# Patient Record
Sex: Male | Born: 1995 | Race: Black or African American | Hispanic: No | Marital: Single | Smoking: Never smoker
Health system: Southern US, Community
[De-identification: ages and names within clinical notes are randomized; demographics above are authoritative.]

## PROBLEM LIST (undated history)

## (undated) ENCOUNTER — Emergency Department (HOSPITAL_COMMUNITY): Admission: EM | Payer: Medicaid Other | Source: Home / Self Care

---
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
## 2017-01-28 ENCOUNTER — Encounter (HOSPITAL_COMMUNITY): Payer: Self-pay

## 2017-01-28 ENCOUNTER — Emergency Department (HOSPITAL_COMMUNITY)
Admission: EM | Admit: 2017-01-28 | Discharge: 2017-01-28 | Disposition: A | Discharge status: Home / Self Care | Payer: Self-pay | Attending: Emergency Medicine | Admitting: Emergency Medicine

## 2017-01-28 DIAGNOSIS — J029 Acute pharyngitis, unspecified: Secondary | ICD-10-CM | POA: Insufficient documentation

## 2017-01-28 DIAGNOSIS — F17200 Nicotine dependence, unspecified, uncomplicated: Secondary | ICD-10-CM | POA: Insufficient documentation

## 2017-01-28 LAB — RAPID STREP SCREEN (MED CTR MEBANE ONLY): Streptococcus, Group A Screen (Direct): NEGATIVE

## 2017-01-28 NOTE — ED Notes (Signed)
PA in room

## 2017-01-28 NOTE — ED Triage Notes (Signed)
Pt c/o sore throat x 1 week and body aches x 2 days. c/o fever. Vomited x 3.

## 2017-01-28 NOTE — Discharge Instructions (Signed)
As discussed, drink plenty of fluids to keep your urine clear. Reintroduce foods slowly starting with broth and blend crackers.  Warm tea with honey, lozenges, popscicles may help with the symptoms.  Follow up with your primary care provider next week.  Return to the emergency department if your throat feels swollen, you have difficulty swallowing, breathing, persistent fever or any other new concerning symptoms.

## 2017-01-28 NOTE — ED Provider Notes (Signed)
[facility]EMERGENCY DEPT Provider Note   CSN: [id number] Arrival date & time: 01/28/17  1038   By signing my name below, I, Freida Busman, attest that this documentation has been prepared under the direction and in the presence of Mathews Robinsons, [place]. Electronically Signed: Freida Busman, Scribe. 01/28/2017. 11:02 AM.  History   Chief Complaint Chief Complaint  Patient presents with  . Fever  . Sore Throat  . Generalized Body Aches     The history is provided by the patient. No language interpreter was used.    HPI Comments:  Jason Hess is a 21 y.o. male who presents to the Emergency Department complaining of a sore throat x 1 week. He notes his pain has progressively worsened and is exacerbated when swallowing but he is able to tolerate his secretions. Pt reports associated subjective fever, body aches, nausea, and vomiting x 2 days. He also notes decreased appetite but states he has been drinking fluids. He has taken OTC sinus meds without relief. No flu shot received this season. No known sick contacts. He denies congestion, cough, ear pain, diarrhea, dysuria, or hematuria.    History reviewed. No pertinent past medical history.  There are no active problems to display for this patient.   History reviewed. No pertinent surgical history.     Home Medications    Prior to Admission medications   Not on File    Family History History reviewed. No pertinent family history.  Social History Social History  Substance Use Topics  . Smoking status: Current Some Day Smoker  . Smokeless tobacco: Never Used  . Alcohol use Yes     Allergies   Patient has no known allergies.   Review of Systems Review of Systems  Constitutional: Positive for appetite change and fever. Negative for chills.  HENT: Positive for sore throat. Negative for congestion and ear pain.   Respiratory: Negative for cough.   Gastrointestinal: Positive for nausea and vomiting. Negative for diarrhea.    Genitourinary: Negative for dysuria and hematuria.  Musculoskeletal: Positive for myalgias.     Physical Exam Updated Vital Signs BP 115/67 (BP Location: Right Arm)   Pulse 89   Temp 99.3 F (37.4 C) (Oral)   Resp 16   Ht  (1.778 m)   Wt 68 kg   SpO2 98%   BMI 21.52 kg/m   Physical Exam  Constitutional: He is oriented to person, place, and time. He appears well-developed and well-nourished. No distress.  Patient is afebrile, non-toxic appearing, seating comfortably in chair in no acute distress.  HENT:  Head: Normocephalic and atraumatic.  Right Ear: Tympanic membrane normal.  Left Ear: Tympanic membrane normal.  Mouth/Throat: Uvula is midline. Tonsillar exudate.  Exudate noted bilaterally  Eyes: Conjunctivae are normal.  Cardiovascular: Normal rate, regular rhythm and normal heart sounds.   Pulmonary/Chest: Effort normal and breath sounds normal. No respiratory distress.  Abdominal: Soft. He exhibits no distension. There is no tenderness.  Neurological: He is alert and oriented to person, place, and time.  Skin: Skin is warm and dry.  Psychiatric: He has a normal mood and affect.  Nursing note and vitals reviewed.    ED Treatments / Results  DIAGNOSTIC STUDIES:  Oxygen Saturation is 98% on RA, normal by my interpretation.    COORDINATION OF CARE:  11:00 AM Discussed treatment plan with pt at bedside and pt agreed to plan.  Labs (all labs ordered are listed, but only abnormal results are displayed) Labs Reviewed  RAPID STREP  SCREEN (NOT AT [facility])  CULTURE, GROUP A STREP [facility])    EKG  EKG Interpretation None       Radiology No results found.  Procedures Procedures (including critical care time)   Medications Ordered in ED Medications - No data to display   Initial Impression / Assessment and Plan / ED Course  I have reviewed the triage vital signs and the nursing notes.  Pertinent labs & imaging results that were available during my  care of the patient were reviewed by me and considered in my medical decision making (see chart for details).    Pt rapid strep test negative. Pt is tolerating secretions. Presentation not concerning for peritonsillar abscess or spread of infection to deep spaces of the throat; patent airway. No cervical adenopathy, no neck pain, swelling, tolerating oral secretions well. Uvula is midline, arches symmetrical and intact. Specific return precautions discussed. Recommended PCP follow up. Pt appears safe for discharge.   Afebrile, without the use of antipyretics. He declined antiemetic at this time. He has been able to keep fluids down. Successful PO challenge.  Patient also had two days of symptoms possibly consistent with flu and tamiflu was discussed with patient. He declined any prescriptions today.  Discussed strict return precautions and advised to return to the emergency department if experiencing any new or worsening symptoms. Instructions were understood and patient agreed with discharge plan.  Final Clinical Impressions(s) / ED Diagnoses   Final diagnoses:  Sore throat    New Prescriptions New Prescriptions   No medications on file   I personally performed the services described in this documentation, which was scribed in my presence. The recorded information has been reviewed and is accurate.   Georgiana Shore, PA-C 01/28/17 1140    Arby Barrette, MD 01/28/17 [phone]

## 2017-01-30 ENCOUNTER — Encounter (HOSPITAL_COMMUNITY): Payer: Self-pay | Admitting: Emergency Medicine

## 2017-01-30 ENCOUNTER — Emergency Department (HOSPITAL_COMMUNITY)
Admission: EM | Admit: 2017-01-30 | Discharge: 2017-01-30 | Disposition: A | Discharge status: Home / Self Care | Payer: Self-pay | Attending: Emergency Medicine | Admitting: Emergency Medicine

## 2017-01-30 DIAGNOSIS — F17200 Nicotine dependence, unspecified, uncomplicated: Secondary | ICD-10-CM | POA: Insufficient documentation

## 2017-01-30 DIAGNOSIS — J028 Acute pharyngitis due to other specified organisms: Secondary | ICD-10-CM | POA: Insufficient documentation

## 2017-01-30 DIAGNOSIS — B9689 Other specified bacterial agents as the cause of diseases classified elsewhere: Secondary | ICD-10-CM | POA: Insufficient documentation

## 2017-01-30 LAB — CULTURE, GROUP A STREP (THRC)

## 2017-01-30 LAB — MONONUCLEOSIS SCREEN: Mono Screen: NEGATIVE

## 2017-01-30 LAB — RAPID STREP SCREEN (MED CTR MEBANE ONLY): STREPTOCOCCUS, GROUP A SCREEN (DIRECT): NEGATIVE

## 2017-01-30 MED ORDER — ACETAMINOPHEN 325 MG PO TABS
ORAL_TABLET | ORAL | Status: AC
  Filled 2017-01-30: qty 2

## 2017-01-30 MED ORDER — ACETAMINOPHEN 325 MG PO TABS
650.0000 mg | ORAL_TABLET | Freq: Once | ORAL | Status: AC | PRN
  Administered 2017-01-30: 650 mg via ORAL

## 2017-01-30 MED ORDER — AMOXICILLIN 500 MG PO CAPS: 500.0000 mg | ORAL_CAPSULE | Freq: Three times a day (TID) | ORAL | 0 refills | Status: AC

## 2017-01-30 NOTE — ED Triage Notes (Signed)
Pt here with sore throat and body aches; pt noted to have fever

## 2017-01-30 NOTE — ED Provider Notes (Signed)
[facility] Provider Note   CSN: [id number] Arrival date & time: 01/30/17  1524  By signing my name below, I, Modena Jansky, attest that this documentation has been prepared under the direction and in the presence of non-physician practitioner, [place] Russo, PA-C. Electronically Signed: Modena Jansky, Scribe. 01/30/2017. 4:08 PM.  History   Chief Complaint Chief Complaint  Patient presents with  . Sore Throat  . Fever   The history is provided by the patient. No language interpreter was used.   HPI Comments: Jason Hess is a 21 y.o. male who presents to the Emergency Department complaining of constant sore throat that started about 1.5 weeks ago. He reports he was having URI-like symptoms and then he noticed his throat swelling 3 days ago when he woke up. He came to the ED on 01/28/18 for the same complaint and tested negative for strep throat. He took Tylenol PTA with minimal relief. His pain is relieved by cold drinks and exacerbated by eating/swallowing. He reports associated subjective fever, chills, generalized myalgias, and headache. Denies any sick contacts, rhinorrhea, nasal congestion, cough, SOB, or other complaints at this time.  History reviewed. No pertinent past medical history.  There are no active problems to display for this patient.   History reviewed. No pertinent surgical history.     Home Medications    Prior to Admission medications   Medication Sig Start Date End Date Taking? Authorizing Provider  amoxicillin (AMOXIL) 500 MG capsule Take 1 capsule (500 mg total) by mouth 3 (three) times daily. 01/30/17 02/06/17  [place] N Russo, PA-C    Family History History reviewed. No pertinent family history.  Social History Social History  Substance Use Topics  . Smoking status: Current Some Day Smoker  . Smokeless tobacco: Never Used  . Alcohol use Yes     Allergies   Patient has no known allergies.   Review of Systems Review of Systems    Constitutional: Positive for chills and fever (Subjective).  HENT: Positive for sore throat. Negative for congestion, postnasal drip, rhinorrhea, trouble swallowing and voice change.   Respiratory: Negative for cough and shortness of breath.   Gastrointestinal: Negative for abdominal pain, nausea and vomiting.  Musculoskeletal: Positive for myalgias (Generalized).  Neurological: Positive for headaches.     Physical Exam Updated Vital Signs BP 127/69 (BP Location: Right Arm)   Pulse 97   Temp (!) 103.6 F (39.8 C) (Oral)   Resp 18   SpO2 98%   Physical Exam  Constitutional: He appears well-developed and well-nourished.  HENT:  Head: Normocephalic and atraumatic.  Right Ear: Tympanic membrane, external ear and ear canal normal.  Left Ear: Tympanic membrane, external ear and ear canal normal.  Mouth/Throat: Uvula is midline. No trismus in the jaw. No uvula swelling. No tonsillar abscesses. Tonsillar exudate.  b/l Tonsillar exudates, edema and erythema. b/l Anterior cervical lymphadenopathy. Pharynx erythematous. Uvula midline, no trismus  Eyes: Conjunctivae are normal.  Neck: Normal range of motion. Neck supple.  Cardiovascular: Normal rate, regular rhythm, normal heart sounds and intact distal pulses.  Exam reveals no friction rub.   No murmur heard. Pulmonary/Chest: Effort normal and breath sounds normal. No respiratory distress. He has no wheezes. He has no rales.  Abdominal: Soft. Bowel sounds are normal. He exhibits no distension. There is no tenderness.  Lymphadenopathy:    He has cervical adenopathy.  Psychiatric: He has a normal mood and affect. His behavior is normal.  Nursing note and vitals reviewed.    ED  Treatments / Results  DIAGNOSTIC STUDIES: Oxygen Saturation is 98% on RA, normal by my interpretation.    COORDINATION OF CARE: 4:12 PM- Pt advised of plan for treatment and pt agrees.  Labs (all labs ordered are listed, but only abnormal results are  displayed) Labs Reviewed  RAPID STREP SCREEN (NOT AT [facility])  CULTURE, GROUP A STREP [facility])  MONONUCLEOSIS SCREEN    EKG  EKG Interpretation None       Radiology No results found.  Procedures Procedures (including critical care time)  Medications Ordered in ED Medications  acetaminophen (TYLENOL) tablet 650 mg (650 mg Oral Given 01/30/17 1546)     Initial Impression / Assessment and Plan / ED Course  I have reviewed the triage vital signs and the nursing notes.  Pertinent labs & imaging results that were available during my care of the patient were reviewed by me and considered in my medical decision making (see chart for details).     Pt febrile with tonsillar edema and exudate; clinical diagnosis of bacterial pharyngitis. Rapid strep ordered in triage, resulted negative. Pt's duration of sx and exam consistent w bacterial etiology, therefore will treat w antibiotics. Treated in the ED NSAIDs w improvement in fever. Discussed importance of water rehydration. Presentation non concerning for PTA or infxn spread to soft tissue. No trismus or uvula deviation. Specific return precautions discussed. Pt able to drink water in ED without difficulty with intact air way. Prescribed Amoxicillin. Recommended PCP follow up.   Patient discussed with Dr. Lynelle Doctor. Discussed results, findings, treatment and follow up. Patient advised of return precautions. Patient verbalized understanding and agreed with plan.  Final Clinical Impressions(s) / ED Diagnoses   Final diagnoses:  Acute bacterial pharyngitis    New Prescriptions Discharge Medication List as of 01/30/2017  7:15 PM    START taking these medications   Details  amoxicillin (AMOXIL) 500 MG capsule Take 1 capsule (500 mg total) by mouth 3 (three) times daily., Starting Thu 01/30/2017, Until Thu 02/06/2017, Print       I personally performed the services described in this documentation, which was scribed in my presence. The recorded  information has been reviewed and is accurate.     [place] N Russo, PA-C 01/31/17 1610    Linwood Dibbles, MD 02/03/17 2203

## 2017-01-30 NOTE — Discharge Instructions (Signed)
Please read instructions below. You can take tylenol or advil as needed to pain and fever. Take your antibiotic as prescribed until they are gone. Return to the ER for inability to swallow liquids, difficulty breathing, new or worsening symptoms.

## 2017-02-01 LAB — CULTURE, GROUP A STREP (THRC)

## 2018-03-13 ENCOUNTER — Other Ambulatory Visit: Payer: Self-pay

## 2018-03-13 ENCOUNTER — Encounter (HOSPITAL_COMMUNITY): Payer: Self-pay

## 2018-03-13 ENCOUNTER — Emergency Department (HOSPITAL_COMMUNITY)
Admission: EM | Admit: 2018-03-13 | Discharge: 2018-03-13 | Disposition: A | Discharge status: Home / Self Care | Payer: Self-pay | Attending: Emergency Medicine | Admitting: Emergency Medicine

## 2018-03-13 DIAGNOSIS — J029 Acute pharyngitis, unspecified: Secondary | ICD-10-CM | POA: Insufficient documentation

## 2018-03-13 DIAGNOSIS — R112 Nausea with vomiting, unspecified: Secondary | ICD-10-CM

## 2018-03-13 DIAGNOSIS — Z5321 Procedure and treatment not carried out due to patient leaving prior to being seen by health care provider: Secondary | ICD-10-CM | POA: Insufficient documentation

## 2018-03-13 LAB — CBC WITH DIFFERENTIAL/PLATELET
Abs Immature Granulocytes: 0.1 10*3/uL (ref 0.0–0.1)
Basophils Absolute: 0 10*3/uL (ref 0.0–0.1)
Basophils Relative: 0 %
Eosinophils Absolute: 0 10*3/uL (ref 0.0–0.7)
Eosinophils Relative: 0 %
HCT: 42.7 % (ref 39.0–52.0)
Hemoglobin: 13 g/dL (ref 13.0–17.0)
Immature Granulocytes: 1 %
Lymphocytes Relative: 9 %
Lymphs Abs: 1.3 10*3/uL (ref 0.7–4.0)
MCH: 22.9 pg — ABNORMAL LOW (ref 26.0–34.0)
MCHC: 30.4 g/dL (ref 30.0–36.0)
MCV: 75.3 fL — ABNORMAL LOW (ref 78.0–100.0)
Monocytes Absolute: 1.5 10*3/uL — ABNORMAL HIGH (ref 0.1–1.0)
Monocytes Relative: 10 %
Neutro Abs: 11.8 10*3/uL — ABNORMAL HIGH (ref 1.7–7.7)
Neutrophils Relative %: 80 %
Platelets: 177 10*3/uL (ref 150–400)
RBC: 5.67 MIL/uL (ref 4.22–5.81)
RDW: 13.2 % (ref 11.5–15.5)
WBC: 14.7 10*3/uL — ABNORMAL HIGH (ref 4.0–10.5)

## 2018-03-13 LAB — COMPREHENSIVE METABOLIC PANEL
ALT: 24 U/L (ref 17–63)
AST: 22 U/L (ref 15–41)
Albumin: 3.8 g/dL (ref 3.5–5.0)
Alkaline Phosphatase: 76 U/L (ref 38–126)
Anion gap: 13 (ref 5–15)
BUN: 9 mg/dL (ref 6–20)
CO2: 25 mmol/L (ref 22–32)
Calcium: 9.6 mg/dL (ref 8.9–10.3)
Chloride: 99 mmol/L — ABNORMAL LOW (ref 101–111)
Creatinine, Ser: 1.33 mg/dL — ABNORMAL HIGH (ref 0.61–1.24)
GFR calc Af Amer: >60 mL/min (ref >60)
GFR calc non Af Amer: >60 mL/min (ref >60)
Glucose, Bld: 118 mg/dL — ABNORMAL HIGH (ref 65–99)
Potassium: 3.9 mmol/L (ref 3.5–5.1)
Sodium: 137 mmol/L (ref 135–145)
Total Bilirubin: 1.2 mg/dL (ref 0.3–1.2)
Total Protein: 8.3 g/dL — ABNORMAL HIGH (ref 6.5–8.1)

## 2018-03-13 LAB — URINALYSIS, ROUTINE W REFLEX MICROSCOPIC
Bilirubin Urine: NEGATIVE
Glucose, UA: NEGATIVE mg/dL
Ketones, ur: 20 mg/dL — AB
Leukocytes, UA: NEGATIVE
Nitrite: NEGATIVE
Protein, ur: 100 mg/dL — AB
Specific Gravity, Urine: 1.028 (ref 1.005–1.030)
pH: 5 (ref 5.0–8.0)

## 2018-03-13 LAB — GROUP A STREP BY PCR: Group A Strep by PCR: NOT DETECTED

## 2018-03-13 LAB — LIPASE, BLOOD: Lipase: 41 U/L (ref 11–51)

## 2018-03-13 MED ORDER — ACETAMINOPHEN 500 MG PO TABS
1000.0000 mg | ORAL_TABLET | Freq: Once | ORAL | Status: AC
  Administered 2018-03-13: 1000 mg via ORAL
  Filled 2018-03-13: qty 2

## 2018-03-13 MED ORDER — ONDANSETRON 4 MG PO TBDP
4.0000 mg | ORAL_TABLET | Freq: Once | ORAL | Status: AC
  Administered 2018-03-13: 4 mg via ORAL
  Filled 2018-03-13: qty 1

## 2018-03-13 NOTE — ED Triage Notes (Signed)
Patient complains of 1 week of abdominal cramping with vomiting, no diarrhea. States that he has decreased appetite with fever, chills and sore throat with swollen lymph nodes-alert and oriented, PA at bedside

## 2018-03-13 NOTE — ED Notes (Signed)
Called pt for vitals x3, no response. 

## 2018-03-13 NOTE — ED Provider Notes (Signed)
Patient placed in Quick Look pathway, seen and evaluated   Chief Complaint: Fever, sore throat  HPI:   Patient presents with 1 week history of subjective fevers and chills and sore throat.  Nausea and vomiting for the past 4 days.  He states he has not been able to keep anything down.  Denies abdominal pain, diarrhea, constipation, melena, hematochezia, or urinary symptoms.  He denies testicular pain, scrotal swelling, penile drainage.  No pain with bowel movements.  He was recently treated for chlamydia after his girlfriend tested positive for chlamydia and came home with a prescription for azithromycin.  He denies any symptoms of STD at the time.  ROS: Positive for fever, sore throat, nausea, vomiting.  Negative for abdominal pain, diarrhea, constipation, testicular pain, scrotal swelling, penile drainage.  Physical Exam:   Gen: No distress  Neuro: Awake and Alert  Skin: Warm    Focused Exam: Posterior oropharynx with tonsillar hypertrophy, tonsillar exudates, and posterior pharyngeal erythema.  No trismus or uvular deviation.  No sublingual abnormalities.  Lungs clear to auscultation bilaterally.  Very mild tenderness to palpation of the suprapubic region, no CVA tenderness.  Murphy sign absent, Rovsing's absent, no tenderness to palpation McBurney's point.   Initiation of care has begun. The patient has been counseled on the process, plan, and necessity for staying for the completion/evaluation, and the remainder of the medical screening examination    Jeanie Sewer, PA-C 03/13/18 1345    Jacalyn Lefevre, MD 03/13/18 1456

## 2018-03-14 ENCOUNTER — Encounter (HOSPITAL_COMMUNITY): Payer: Self-pay

## 2018-03-14 ENCOUNTER — Other Ambulatory Visit: Payer: Self-pay

## 2018-03-14 ENCOUNTER — Emergency Department (HOSPITAL_COMMUNITY)
Admission: EM | Admit: 2018-03-14 | Discharge: 2018-03-14 | Disposition: A | Discharge status: Home / Self Care | Payer: Self-pay | Attending: Emergency Medicine | Admitting: Emergency Medicine

## 2018-03-14 DIAGNOSIS — J029 Acute pharyngitis, unspecified: Secondary | ICD-10-CM | POA: Insufficient documentation

## 2018-03-14 DIAGNOSIS — F17200 Nicotine dependence, unspecified, uncomplicated: Secondary | ICD-10-CM | POA: Insufficient documentation

## 2018-03-14 DIAGNOSIS — R112 Nausea with vomiting, unspecified: Secondary | ICD-10-CM | POA: Insufficient documentation

## 2018-03-14 LAB — COMPREHENSIVE METABOLIC PANEL
ALT: 25 U/L (ref 17–63)
ANION GAP: 13 (ref 5–15)
AST: 23 U/L (ref 15–41)
Albumin: 3.7 g/dL (ref 3.5–5.0)
Alkaline Phosphatase: 72 U/L (ref 38–126)
BUN: 8 mg/dL (ref 6–20)
CO2: 24 mmol/L (ref 22–32)
Calcium: 9.2 mg/dL (ref 8.9–10.3)
Chloride: 96 mmol/L — ABNORMAL LOW (ref 101–111)
Creatinine, Ser: 1.2 mg/dL (ref 0.61–1.24)
GFR calc non Af Amer: >60 mL/min (ref >60)
Glucose, Bld: 139 mg/dL — ABNORMAL HIGH (ref 65–99)
Potassium: 3.2 mmol/L — ABNORMAL LOW (ref 3.5–5.1)
SODIUM: 133 mmol/L — AB (ref 135–145)
TOTAL PROTEIN: 8.3 g/dL — AB (ref 6.5–8.1)
Total Bilirubin: 1.2 mg/dL (ref 0.3–1.2)

## 2018-03-14 LAB — MONONUCLEOSIS SCREEN: Mono Screen: NEGATIVE

## 2018-03-14 LAB — CBC WITH DIFFERENTIAL/PLATELET
Abs Immature Granulocytes: 0.1 10*3/uL (ref 0.0–0.1)
BASOS ABS: 0 10*3/uL (ref 0.0–0.1)
BASOS PCT: 0 %
EOS ABS: 0 10*3/uL (ref 0.0–0.7)
EOS PCT: 0 %
HCT: 42.1 % (ref 39.0–52.0)
HEMOGLOBIN: 12.7 g/dL — AB (ref 13.0–17.0)
Immature Granulocytes: 0 %
LYMPHS PCT: 8 %
Lymphs Abs: 1.1 10*3/uL (ref 0.7–4.0)
MCH: 23.2 pg — AB (ref 26.0–34.0)
MCHC: 30.2 g/dL (ref 30.0–36.0)
MCV: 76.8 fL — ABNORMAL LOW (ref 78.0–100.0)
MONO ABS: 1.1 10*3/uL — AB (ref 0.1–1.0)
Monocytes Relative: 8 %
Neutro Abs: 11.8 10*3/uL — ABNORMAL HIGH (ref 1.7–7.7)
Neutrophils Relative %: 84 %
Platelets: 174 10*3/uL (ref 150–400)
RBC: 5.48 MIL/uL (ref 4.22–5.81)
RDW: 13.1 % (ref 11.5–15.5)
WBC: 14.1 10*3/uL — ABNORMAL HIGH (ref 4.0–10.5)

## 2018-03-14 MED ORDER — IBUPROFEN 800 MG PO TABS
800.0000 mg | ORAL_TABLET | Freq: Once | ORAL | Status: AC
  Administered 2018-03-14: 800 mg via ORAL
  Filled 2018-03-14: qty 1

## 2018-03-14 MED ORDER — PROMETHAZINE HCL 12.5 MG PO TABS: 12.5000 mg | ORAL_TABLET | Freq: Four times a day (QID) | ORAL | 0 refills | Status: DC | PRN

## 2018-03-14 MED ORDER — ACETAMINOPHEN 500 MG PO TABS
1000.0000 mg | ORAL_TABLET | Freq: Once | ORAL | Status: AC
  Administered 2018-03-14: 1000 mg via ORAL
  Filled 2018-03-14: qty 2

## 2018-03-14 MED ORDER — ONDANSETRON 4 MG PO TBDP
4.0000 mg | ORAL_TABLET | Freq: Once | ORAL | Status: AC
  Administered 2018-03-14: 4 mg via ORAL
  Filled 2018-03-14: qty 1

## 2018-03-14 MED ORDER — AMOXICILLIN 500 MG PO CAPS: 500.0000 mg | ORAL_CAPSULE | Freq: Three times a day (TID) | ORAL | 0 refills | Status: DC

## 2018-03-14 NOTE — ED Provider Notes (Signed)
[facility] EMERGENCY DEPARTMENT Provider Note   CSN: [id number] Arrival date & time: 03/14/18  1358     History   Chief Complaint Chief Complaint  Patient presents with  . recheck throat    HPI Jason Hess is a 22 y.o. male.  HPI Jason Hess is a 22 y.o. male presents to emergency department complaining of fever, sore throat, vomiting for a week.  States was seen in emergency department yesterday, had blood work done, showed elevated white blood cell count.  He states he went home and continued to vomit.  Fever up to 102 at home for a week now.  Denies any cough.  No congestion.  No abdominal pain.  States he would like to be checked for STD.  He does report oral intercourse.  He has taken Tylenol and Motrin, last dose was yesterday.  No recent sick contacts.  Reports several loose stools, but denies frequent diarrhea.  Reports some headache, no neck pain or stiffness.  No rashes.  No tick exposure.  No medical problems.   History reviewed. No pertinent past medical history.  There are no active problems to display for this patient.   History reviewed. No pertinent surgical history.      Home Medications    Prior to Admission medications   Not on File    Family History No family history on file.  Social History Social History   Tobacco Use  . Smoking status: Current Some Day Smoker  . Smokeless tobacco: Never Used  Substance Use Topics  . Alcohol use: Yes  . Drug use: Yes    Frequency: 3.0 times per week    Types: Marijuana     Allergies   Patient has no known allergies.   Review of Systems Review of Systems  Constitutional: Positive for chills and fever.  HENT: Positive for sore throat. Negative for congestion and trouble swallowing.   Respiratory: Negative for cough, chest tightness and shortness of breath.   Cardiovascular: Negative for chest pain, palpitations and leg swelling.  Gastrointestinal: Positive for nausea and vomiting.  Negative for abdominal distention, abdominal pain and diarrhea.  Genitourinary: Negative for dysuria, frequency, hematuria and urgency.  Musculoskeletal: Negative for arthralgias, myalgias, neck pain and neck stiffness.  Skin: Negative for rash.  Allergic/Immunologic: Negative for immunocompromised state.  Neurological: Positive for headaches. Negative for dizziness, weakness, light-headedness and numbness.  All other systems reviewed and are negative.    Physical Exam Updated Vital Signs BP 114/69   Pulse 80   Temp 99 F (37.2 C) (Oral)   Resp 18   SpO2 100%   Physical Exam  Constitutional: He appears well-developed and well-nourished. No distress.  HENT:  Head: Normocephalic and atraumatic.  Nose: Nose normal.  Tonsils are enlarged bilaterally, with exudate.  Uvula is midline.  Eyes: Conjunctivae are normal.  Neck: Normal range of motion. Neck supple.  No meningismus  Cardiovascular: Normal rate, regular rhythm and normal heart sounds.  Pulmonary/Chest: Effort normal. No respiratory distress. He has no wheezes. He has no rales.  Abdominal: Soft. Bowel sounds are normal. He exhibits no distension. There is no tenderness. There is no rebound.  Musculoskeletal: He exhibits no edema.  Lymphadenopathy:    He has cervical adenopathy.  Neurological: He is alert.  Skin: Skin is warm and dry.  Nursing note and vitals reviewed.    ED Treatments / Results  Labs (all labs ordered are listed, but only abnormal results are displayed) Labs Reviewed  CBC  WITH DIFFERENTIAL/PLATELET  COMPREHENSIVE METABOLIC PANEL  MONONUCLEOSIS SCREEN  RPR  HIV ANTIBODY (ROUTINE TESTING)  GC/CHLAMYDIA PROBE AMP ([place]) [facility]    EKG None  Radiology No results found.  Procedures Procedures (including critical care time)  Medications Ordered in ED Medications  acetaminophen (TYLENOL) tablet 1,000 mg (has no administration in time range)  ondansetron (ZOFRAN-ODT) disintegrating  tablet 4 mg (4 mg Oral Given 03/14/18 1629)     Initial Impression / Assessment and Plan / ED Course  I have reviewed the triage vital signs and the nursing notes.  Pertinent labs & imaging results that were available during my care of the patient were reviewed by me and considered in my medical decision making (see chart for details).     Patient in emergency department for fever for a week now, sore throat, nausea and vomiting.  Exam is really otherwise unremarkable.  There is no meningismus.  There is no abdominal tenderness.  Lungs are clear.  He does have enlarged tonsils with exudate bilaterally.  There is diffuse swollen cervical and posterior lymphadenopathy in the neck.  I suspect patient may have mononucleosis.  We will test him for a.  He is also concerned about STD.  I will swab his throat for gonorrhea and chlamydia, will also get RPR and HIV test.  We will repeat lab work from yesterday since has been vomiting nonstop.  He was vomiting in triage and received Zofran and feels better.  8:19 PM Monospot is negative.  White blood cell count slightly improved, but still high 14.1.  Patient's nausea improved with Zofran.  His abdomen is completely benign and nontender.  Do not think he needs any further imaging of his abdomen.  He does have pretty horrible looking throat, with enlarged tonsils and exudate.  He has fever.  He has lymphadenopathy.  I suspect may be his strep screen yesterday was falsely negative, or he possibly could have another bacterial pharyngeal infection.  He has had fever now for a week.  His vital signs however are all within normal, and he does not appear to be septic.  He is drinking fluids.  He is feeling better.  Although it is not documented, I checked his temperature early myself and it was 102.8.  He was given Tylenol and Motrin, and temperature improved down to 100.4 at this time.  I will discharge him home with antibiotics.  Will start on amoxicillin.  We will have  him take Tylenol Motrin for fever.  I will also given prescription for Phenergan for nausea.  I will have him follow-up with family doctor.  We discussed strict return precautions if not improving.  Gonorrhea, chlamydia, RPR, HIV test are pending  Vitals:   03/14/18 1440 03/14/18 1736 03/14/18 1834  BP: 114/69  118/62  Pulse: 80  70  Resp: 18  20  Temp: 99 F (37.2 C) (!) 101.2 F (38.4 C) (!) 100.4 F (38 C)  TempSrc: Oral Oral Oral  SpO2: 100%  98%     Final Clinical Impressions(s) / ED Diagnoses   Final diagnoses:  Pharyngitis, unspecified etiology  Nausea and vomiting, intractability of vomiting not specified, unspecified vomiting type    ED Discharge Orders        Ordered    amoxicillin (AMOXIL) 500 MG capsule  3 times daily     03/14/18 2024    promethazine (PHENERGAN) 12.5 MG tablet  Every 6 hours PRN     03/14/18 2024  Jaynie Crumble, PA-C 03/14/18 2025    Melene Plan, DO 03/14/18 2356

## 2018-03-14 NOTE — ED Notes (Signed)
Pt verbalized understanding discharge instructions and denies any further needs or questions at this time. VS stable, ambulatory and steady gait.   

## 2018-03-14 NOTE — Discharge Instructions (Signed)
Take amoxicillin as prescribed until all gone.  Take Phenergan for nausea and vomiting.  Take Tylenol and Motrin for fever and pain.  Salt water gargles for your sore throat.  Please follow-up with family doctor if not improving.  Return if worsening symptoms.

## 2018-03-14 NOTE — ED Triage Notes (Signed)
Patient here yesterday for vomiting, fever and sore throat. Had medical screening with labs and meds and left prior to being dispo. Here today for ongoing sore throat

## 2018-03-15 LAB — HIV ANTIBODY (ROUTINE TESTING W REFLEX): HIV SCREEN 4TH GENERATION: NONREACTIVE

## 2018-03-15 LAB — RPR: RPR: NONREACTIVE

## 2018-03-16 LAB — GC/CHLAMYDIA PROBE AMP (~~LOC~~) NOT AT ARMC
Chlamydia: NEGATIVE
Neisseria Gonorrhea: NEGATIVE

## 2018-06-04 ENCOUNTER — Emergency Department (HOSPITAL_COMMUNITY)
Admission: EM | Admit: 2018-06-04 | Discharge: 2018-06-04 | Disposition: A | Discharge status: Home / Self Care | Payer: Self-pay | Attending: Emergency Medicine | Admitting: Emergency Medicine

## 2018-06-04 ENCOUNTER — Encounter (HOSPITAL_COMMUNITY): Payer: Self-pay

## 2018-06-04 ENCOUNTER — Other Ambulatory Visit: Payer: Self-pay

## 2018-06-04 DIAGNOSIS — Z79899 Other long term (current) drug therapy: Secondary | ICD-10-CM | POA: Insufficient documentation

## 2018-06-04 DIAGNOSIS — Z711 Person with feared health complaint in whom no diagnosis is made: Secondary | ICD-10-CM

## 2018-06-04 DIAGNOSIS — F17210 Nicotine dependence, cigarettes, uncomplicated: Secondary | ICD-10-CM | POA: Insufficient documentation

## 2018-06-04 DIAGNOSIS — Z202 Contact with and (suspected) exposure to infections with a predominantly sexual mode of transmission: Secondary | ICD-10-CM | POA: Insufficient documentation

## 2018-06-04 DIAGNOSIS — Z7251 High risk heterosexual behavior: Secondary | ICD-10-CM

## 2018-06-04 MED ORDER — AZITHROMYCIN 250 MG PO TABS
1000.0000 mg | ORAL_TABLET | Freq: Once | ORAL | Status: AC
  Administered 2018-06-04: 1000 mg via ORAL
  Filled 2018-06-04: qty 4

## 2018-06-04 MED ORDER — LIDOCAINE HCL (PF) 1 % IJ SOLN
INTRAMUSCULAR | Status: AC
  Filled 2018-06-04: qty 5

## 2018-06-04 MED ORDER — CEFTRIAXONE SODIUM 250 MG IJ SOLR
250.0000 mg | Freq: Once | INTRAMUSCULAR | Status: AC
  Administered 2018-06-04: 250 mg via INTRAMUSCULAR
  Filled 2018-06-04: qty 250

## 2018-06-04 NOTE — ED Provider Notes (Signed)
[facility] Provider Note   CSN: [id number] Arrival date & time: 06/04/18  1430     History   Chief Complaint Chief Complaint  Patient presents with  . Exposure to STD    HPI Jason Hess is a 22 y.o. otherwise healthy male, who presents to the ED with complaints of wanting STD testing and treatment. Patient states that one of his sexual partners recently called him and told him that she tested positive for "something", he isn't sure what she tested positive for she advised him that he should be tested for STDs.  He states that he used condoms with sexual partner that called him, but he received oral sex without any protection so he still wants to be tested treated today. He has been sexually active with more than 9110 male partners in the last year, sometimes uses protection and sometimes doesn't. He has absolutely no complaints or concerns, just wants STD testing and treatment today. He denies any dysuria, hematuria, testicular pain or swelling, genital sores, penile discharge, penile swelling or pain, fevers, chills, CP, SOB, abd pain, N/V/D/C, myalgias, arthralgias, numbness, tingling, focal weakness, or any other complaints at this time.   The history is provided by the patient and medical records. No language interpreter was used.  Exposure to STD  Pertinent negatives include no chest pain, no abdominal pain and no shortness of breath.    History reviewed. No pertinent past medical history.  There are no active problems to display for this patient.   History reviewed. No pertinent surgical history.      Home Medications    Prior to Admission medications   Medication Sig Start Date End Date Taking? Authorizing Provider  acetaminophen (TYLENOL) 325 MG tablet Take 650 mg by mouth every 6 (six) hours as needed for mild pain.    [provider]  amoxicillin (AMOXIL) 500 MG capsule Take 1 capsule (500 mg total) by mouth 3 (three)  times daily. 03/14/18   Kirichenko, Lemont Fillersatyana, PA-C  promethazine (PHENERGAN) 12.5 MG tablet Take 1 tablet (12.5 mg total) by mouth every 6 (six) hours as needed for nausea or vomiting. 03/14/18   Jaynie CrumbleKirichenko, Tatyana, PA-C    Family History History reviewed. No pertinent family history.  Social History Social History   Tobacco Use  . Smoking status: Current Some Day Smoker  . Smokeless tobacco: Never Used  Substance Use Topics  . Alcohol use: Yes  . Drug use: Yes    Frequency: 3.0 times per week    Types: Marijuana     Allergies   Patient has no known allergies.   Review of Systems Review of Systems  Constitutional: Negative for chills and fever.  Respiratory: Negative for shortness of breath.   Cardiovascular: Negative for chest pain.  Gastrointestinal: Negative for abdominal pain, constipation, diarrhea, nausea and vomiting.  Genitourinary: Negative for discharge, dysuria, genital sores, hematuria, penile pain, penile swelling, scrotal swelling and testicular pain.  Musculoskeletal: Negative for arthralgias and myalgias.  Skin: Negative for color change.  Allergic/Immunologic: Negative for immunocompromised state.  Neurological: Negative for weakness and numbness.  Psychiatric/Behavioral: Negative for confusion.   All other systems reviewed and are negative for acute change except as noted in the HPI.    Physical Exam Updated Vital Signs BP 124/70   Pulse 61   Temp 98.6 F (37 C) (Oral)   Resp 18   SpO2 100%   Physical Exam  Constitutional: He is oriented to person, place, and time.  Vital signs are normal. He appears well-developed and well-nourished.  Non-toxic appearance. No distress.  Afebrile, nontoxic, NAD  HENT:  Head: Normocephalic and atraumatic.  Mouth/Throat: Oropharynx is clear and moist and mucous membranes are normal.  Eyes: Conjunctivae and EOM are normal. Right eye exhibits no discharge. Left eye exhibits no discharge.  Neck: Normal range of motion.  Neck supple.  Cardiovascular: Normal rate, regular rhythm, normal heart sounds and intact distal pulses. Exam reveals no gallop and no friction rub.  No murmur heard. Pulmonary/Chest: Effort normal and breath sounds normal. No respiratory distress. He has no decreased breath sounds. He has no wheezes. He has no rhonchi. He has no rales.  Abdominal: Soft. Normal appearance and bowel sounds are normal. He exhibits no distension. There is no tenderness. There is no rigidity, no rebound, no guarding, no CVA tenderness, no tenderness at McBurney's point and negative Murphy's sign.  Soft, NTND, +BS throughout, no r/g/r, neg murphy's, neg mcburney's, no CVA TTP   Genitourinary:  Genitourinary Comments: Pt declined  Musculoskeletal: Normal range of motion.  Neurological: He is alert and oriented to person, place, and time. He has normal strength. No sensory deficit.  Skin: Skin is warm, dry and intact. No rash noted.  Psychiatric: He has a normal mood and affect.  Nursing note and vitals reviewed.    ED Treatments / Results  Labs (all labs ordered are listed, but only abnormal results are displayed) Labs Reviewed  RPR  HIV ANTIBODY (ROUTINE TESTING)  GC/CHLAMYDIA PROBE AMP ([place]) NOT AT [facility]    EKG None  Radiology No results found.  Procedures Procedures (including critical care time)  Medications Ordered in ED Medications  lidocaine (PF) (XYLOCAINE) 1 % injection (has no administration in time range)  azithromycin (ZITHROMAX) tablet 1,000 mg (1,000 mg Oral Given 06/04/18 1539)    And  cefTRIAXone (ROCEPHIN) injection 250 mg (250 mg Intramuscular Given 06/04/18 1540)     Initial Impression / Assessment and Plan / ED Course  I have reviewed the triage vital signs and the nursing notes.  Pertinent labs & imaging results that were available during my care of the patient were reviewed by me and considered in my medical decision making (see chart for details).     22 y.o.  male here with concerns for STD, states a woman he was sexually active with called him and told him that she tested positive for something (unsure what) and that he should get tested. He is asymptomatic, has no complaints. Declines genital exam. Abd soft and nontender. Advised that he needs to use the health dept for STD testing, discussed why this is not an emergency. He continues to request testing and treatment today, states he's already here and would like to have this done. Advised that this would be a one-time courtesy but in the future he needs to f/up with health dept for this nonemergent issue. Will empirically cover for GC/CT, and send HIV/RPR/GC/CT testing today. Discussed abstinence x10 days. F/up with health dept for future STD concerns. Safe sex encouraged, and discussed having partners tested and treated before re-engaging in intercourse after the 10 day abstinence period.  I explained the diagnosis and have given explicit precautions to return to the ER including for any other new or worsening symptoms. The patient understands and accepts the medical plan as it's been dictated and I have answered their questions. Discharge instructions concerning home care and prescriptions have been given. The patient is STABLE and is discharged to home  in good condition.    Final Clinical Impressions(s) / ED Diagnoses   Final diagnoses:  Concern about STD in male without diagnosis  Unprotected sex    ED Discharge Orders    [address] [place], [place] 06/04/18 1541    Gerhard Munch, MD 06/04/18 2215

## 2018-06-04 NOTE — Discharge Instructions (Addendum)
You have been treated for gonorrhea and chlamydia in the ER but the hospital will call you if lab is positive. You were tested for HIV and Syphilis, and the hospital will call you if the lab is positive. NO SEXUAL INTERCOURSE FOR AT LEAST 10 DAYS AFTER TODAY'S VISIT, THIS WILL INVALIDATE YOUR TREATMENT HERE. DO NOT ENGAGE IN SEXUAL ACTIVITY UNTIL YOU FIND OUT ABOUT YOUR RESULTS AND HAVE PARTNERS TESTED AND TREATED. ALL PARTNERS MUST BE TESTED AND TREATED FOR STD'S. ALWAYS USE CONDOMS WHEN ENGAGING IN INTERCOURSE. [facility] for future STD concerns or screenings.

## 2018-06-04 NOTE — ED Notes (Signed)
Patient acuity 4, see provider assessment.  

## 2018-06-04 NOTE — ED Triage Notes (Signed)
Pt states he wants to be checked for STD. Denies symptoms but states due to recent conversation with partner he should be checked.

## 2018-06-05 LAB — RPR: RPR Ser Ql: NONREACTIVE

## 2018-06-05 LAB — GC/CHLAMYDIA PROBE AMP (~~LOC~~) NOT AT ARMC
CHLAMYDIA, DNA PROBE: NEGATIVE
NEISSERIA GONORRHEA: NEGATIVE

## 2018-06-05 LAB — HIV ANTIBODY (ROUTINE TESTING W REFLEX): HIV Screen 4th Generation wRfx: NONREACTIVE

## 2020-05-28 ENCOUNTER — Other Ambulatory Visit: Payer: Self-pay

## 2020-05-28 ENCOUNTER — Emergency Department (HOSPITAL_COMMUNITY)
Admission: EM | Admit: 2020-05-28 | Discharge: 2020-05-29 | Disposition: A | Discharge status: Home / Self Care | Payer: Self-pay | Attending: Emergency Medicine | Admitting: Emergency Medicine

## 2020-05-28 ENCOUNTER — Encounter (HOSPITAL_COMMUNITY): Payer: Self-pay

## 2020-05-28 DIAGNOSIS — R630 Anorexia: Secondary | ICD-10-CM | POA: Insufficient documentation

## 2020-05-28 DIAGNOSIS — Z5321 Procedure and treatment not carried out due to patient leaving prior to being seen by health care provider: Secondary | ICD-10-CM | POA: Insufficient documentation

## 2020-05-28 DIAGNOSIS — R5381 Other malaise: Secondary | ICD-10-CM | POA: Insufficient documentation

## 2020-05-28 DIAGNOSIS — R112 Nausea with vomiting, unspecified: Secondary | ICD-10-CM | POA: Insufficient documentation

## 2020-05-28 DIAGNOSIS — R109 Unspecified abdominal pain: Secondary | ICD-10-CM | POA: Insufficient documentation

## 2020-05-28 LAB — URINALYSIS, ROUTINE W REFLEX MICROSCOPIC
Bilirubin Urine: NEGATIVE
Glucose, UA: NEGATIVE mg/dL
Hgb urine dipstick: NEGATIVE
Ketones, ur: 5 mg/dL — AB
Leukocytes,Ua: NEGATIVE
Nitrite: NEGATIVE
Protein, ur: 30 mg/dL — AB
Specific Gravity, Urine: 1.023 (ref 1.005–1.030)
pH: 5 (ref 5.0–8.0)

## 2020-05-28 LAB — COMPREHENSIVE METABOLIC PANEL
ALT: 46 U/L — ABNORMAL HIGH (ref 0–44)
AST: 35 U/L (ref 15–41)
Albumin: 4.4 g/dL (ref 3.5–5.0)
Alkaline Phosphatase: 110 U/L (ref 38–126)
Anion gap: 13 (ref 5–15)
BUN: 11 mg/dL (ref 6–20)
CO2: 26 mmol/L (ref 22–32)
Calcium: 9.7 mg/dL (ref 8.9–10.3)
Chloride: 98 mmol/L (ref 98–111)
Creatinine, Ser: 1.15 mg/dL (ref 0.61–1.24)
GFR calc Af Amer: >60 mL/min (ref >60)
GFR calc non Af Amer: >60 mL/min (ref >60)
Glucose, Bld: 120 mg/dL — ABNORMAL HIGH (ref 70–99)
Potassium: 3.5 mmol/L (ref 3.5–5.1)
Sodium: 137 mmol/L (ref 135–145)
Total Bilirubin: 1 mg/dL (ref 0.3–1.2)
Total Protein: 8.7 g/dL — ABNORMAL HIGH (ref 6.5–8.1)

## 2020-05-28 LAB — CBC
HCT: 46.4 % (ref 39.0–52.0)
Hemoglobin: 13.9 g/dL (ref 13.0–17.0)
MCH: 22.7 pg — ABNORMAL LOW (ref 26.0–34.0)
MCHC: 30 g/dL (ref 30.0–36.0)
MCV: 75.9 fL — ABNORMAL LOW (ref 80.0–100.0)
Platelets: 205 10*3/uL (ref 150–400)
RBC: 6.11 MIL/uL — ABNORMAL HIGH (ref 4.22–5.81)
RDW: 14.2 % (ref 11.5–15.5)
WBC: 12 10*3/uL — ABNORMAL HIGH (ref 4.0–10.5)
nRBC: 0 % (ref 0.0–0.2)

## 2020-05-28 LAB — LIPASE, BLOOD: Lipase: 30 U/L (ref 11–51)

## 2020-05-28 MED ORDER — ONDANSETRON 4 MG PO TBDP
4.0000 mg | ORAL_TABLET | Freq: Once | ORAL | Status: AC | PRN
  Administered 2020-05-28: 4 mg via ORAL
  Filled 2020-05-28: qty 1

## 2020-05-28 NOTE — ED Triage Notes (Signed)
Pt reports N/V that started Friday. Reports lack of appetite and generalized malaise as well. Denies cough or SOB.

## 2020-05-29 ENCOUNTER — Other Ambulatory Visit: Payer: Self-pay

## 2020-05-29 NOTE — ED Notes (Signed)
Pt not in the lobby when out to reassess vital signs. 

## 2020-08-09 ENCOUNTER — Emergency Department (HOSPITAL_COMMUNITY)
Admission: EM | Admit: 2020-08-09 | Discharge: 2020-08-09 | Disposition: A | Discharge status: Home / Self Care | Payer: Medicaid Other | Attending: Emergency Medicine | Admitting: Emergency Medicine

## 2020-08-09 ENCOUNTER — Other Ambulatory Visit: Payer: Self-pay

## 2020-08-09 ENCOUNTER — Encounter (HOSPITAL_COMMUNITY): Payer: Self-pay | Admitting: Emergency Medicine

## 2020-08-09 DIAGNOSIS — F17200 Nicotine dependence, unspecified, uncomplicated: Secondary | ICD-10-CM | POA: Insufficient documentation

## 2020-08-09 DIAGNOSIS — F1590 Other stimulant use, unspecified, uncomplicated: Secondary | ICD-10-CM | POA: Insufficient documentation

## 2020-08-09 DIAGNOSIS — R112 Nausea with vomiting, unspecified: Secondary | ICD-10-CM | POA: Insufficient documentation

## 2020-08-09 LAB — CBC
HCT: 44.5 % (ref 39.0–52.0)
Hemoglobin: 13.3 g/dL (ref 13.0–17.0)
MCH: 23.1 pg — ABNORMAL LOW (ref 26.0–34.0)
MCHC: 29.9 g/dL — ABNORMAL LOW (ref 30.0–36.0)
MCV: 77.1 fL — ABNORMAL LOW (ref 80.0–100.0)
Platelets: 222 10*3/uL (ref 150–400)
RBC: 5.77 MIL/uL (ref 4.22–5.81)
RDW: 14.2 % (ref 11.5–15.5)
WBC: 18.2 10*3/uL — ABNORMAL HIGH (ref 4.0–10.5)
nRBC: 0 % (ref 0.0–0.2)

## 2020-08-09 LAB — COMPREHENSIVE METABOLIC PANEL
ALT: 20 U/L (ref 0–44)
AST: 18 U/L (ref 15–41)
Albumin: 4.3 g/dL (ref 3.5–5.0)
Alkaline Phosphatase: 104 U/L (ref 38–126)
Anion gap: 16 — ABNORMAL HIGH (ref 5–15)
BUN: 13 mg/dL (ref 6–20)
CO2: 21 mmol/L — ABNORMAL LOW (ref 22–32)
Calcium: 9.7 mg/dL (ref 8.9–10.3)
Chloride: 99 mmol/L (ref 98–111)
Creatinine, Ser: 1.03 mg/dL (ref 0.61–1.24)
GFR, Estimated: >60 mL/min (ref >60)
Glucose, Bld: 148 mg/dL — ABNORMAL HIGH (ref 70–99)
Potassium: 3.8 mmol/L (ref 3.5–5.1)
Sodium: 136 mmol/L (ref 135–145)
Total Bilirubin: 0.8 mg/dL (ref 0.3–1.2)
Total Protein: 8.3 g/dL — ABNORMAL HIGH (ref 6.5–8.1)

## 2020-08-09 LAB — URINALYSIS, ROUTINE W REFLEX MICROSCOPIC
Bilirubin Urine: NEGATIVE
Glucose, UA: NEGATIVE mg/dL
Hgb urine dipstick: NEGATIVE
Ketones, ur: 5 mg/dL — AB
Leukocytes,Ua: NEGATIVE
Nitrite: NEGATIVE
Protein, ur: 100 mg/dL — AB
Specific Gravity, Urine: 1.032 — ABNORMAL HIGH (ref 1.005–1.030)
pH: 5 (ref 5.0–8.0)

## 2020-08-09 LAB — LIPASE, BLOOD: Lipase: 39 U/L (ref 11–51)

## 2020-08-09 MED ORDER — SODIUM CHLORIDE 0.9 % IV BOLUS
1000.0000 mL | Freq: Once | INTRAVENOUS | Status: AC
  Administered 2020-08-09: 1000 mL via INTRAVENOUS

## 2020-08-09 MED ORDER — ONDANSETRON HCL 4 MG/2ML IJ SOLN
4.0000 mg | Freq: Once | INTRAMUSCULAR | Status: AC
  Administered 2020-08-09: 4 mg via INTRAVENOUS
  Filled 2020-08-09: qty 2

## 2020-08-09 MED ORDER — PROMETHAZINE HCL 25 MG PO TABS: 25.0000 mg | ORAL_TABLET | Freq: Four times a day (QID) | ORAL | 0 refills | Status: DC | PRN

## 2020-08-09 MED ORDER — PROMETHAZINE HCL 25 MG/ML IJ SOLN
25.0000 mg | Freq: Once | INTRAMUSCULAR | Status: AC
  Administered 2020-08-09: 25 mg via INTRAVENOUS
  Filled 2020-08-09: qty 1

## 2020-08-09 NOTE — ED Notes (Signed)
Patient did not tolerate PO fluids. Patient currently vomiting.

## 2020-08-09 NOTE — ED Triage Notes (Signed)
Patient reports multiple emesis last night , no diarrhea , denies abdominal pain or fever .

## 2020-08-09 NOTE — Discharge Instructions (Signed)
Please read and follow all provided instructions.  Your diagnoses today include:  1. Non-intractable vomiting with nausea, unspecified vomiting type     Tests performed today include:  Blood counts and electrolytes - high white blood cells  Blood tests to check liver and kidney function  Blood tests to check pancreas function  Urine test to look for infection  Vital signs. See below for your results today.   Medications prescribed:   Phenergan (promethazine) - for nausea and vomiting  Take any prescribed medications only as directed.  Home care instructions:   Follow any educational materials contained in this packet.  Follow-up instructions: Please follow-up with your primary care provider in the next 3 days for further evaluation of your symptoms.    Return instructions:  SEEK IMMEDIATE MEDICAL ATTENTION IF:  The pain does not go away or becomes severe   A temperature above 101F develops   Repeated vomiting occurs (multiple episodes)   The pain becomes localized to portions of the abdomen. The right side could possibly be appendicitis. In an adult, the left lower portion of the abdomen could be colitis or diverticulitis.   Blood is being passed in stools or vomit (bright red or black tarry stools)   You develop chest pain, difficulty breathing, dizziness or fainting, or become confused, poorly responsive, or inconsolable (young children)  If you have any other emergent concerns regarding your health  Additional Information: Abdominal (belly) pain can be caused by many things. Your caregiver performed an examination and possibly ordered blood/urine tests and imaging (CT scan, x-rays, ultrasound). Many cases can be observed and treated at home after initial evaluation in the emergency department. Even though you are being discharged home, abdominal pain can be unpredictable. Therefore, you need a repeated exam if your pain does not resolve, returns, or worsens. Most  patients with abdominal pain don't have to be admitted to the hospital or have surgery, but serious problems like appendicitis and gallbladder attacks can start out as nonspecific pain. Many abdominal conditions cannot be diagnosed in one visit, so follow-up evaluations are very important.  Your vital signs today were: BP 118/69    Pulse 67    Temp 98.5 F (36.9 C) (Oral)    Resp 14    Ht 5\' 10"  (1.778 m)    Wt 120 kg    SpO2 100%    BMI 37.96 kg/m  If your blood pressure (bp) was elevated above 135/85 this visit, please have this repeated by your doctor within one month. --------------

## 2020-08-09 NOTE — ED Provider Notes (Signed)
[facility] Provider Note   CSN: [id number] Arrival date & time: 08/09/20  7846     History Chief Complaint  Patient presents with  . Emesis    [facility] is a 24 y.o. male.  Patient presents the emergency department for evaluation of vomiting.  Patient states that he was feeling well until last evening.  He developed nausea and vomiting, too many episodes to count.  He denies having abdominal pain associated.  No chest pain or shortness of breath.  Patient's family member at bedside states that he was "burning up" yesterday.  They did not measure a temperature.  No diarrhea or urinary symptoms.  He has taken a ZzzQuil, NyQuil to treat symptoms.  Denies heavy NSAID use or alcohol use.       History reviewed. No pertinent past medical history.  There are no problems to display for this patient.   History reviewed. No pertinent surgical history.     No family history on file.  Social History   Tobacco Use  . Smoking status: Current Some Day Smoker  . Smokeless tobacco: Never Used  Substance Use Topics  . Alcohol use: Yes  . Drug use: Yes    Frequency: 3.0 times per week    Types: Marijuana    Home Medications Prior to Admission medications   Not on File    Allergies    Patient has no known allergies.  Review of Systems   Review of Systems  Constitutional: Negative for appetite change and fever.  HENT: Negative for rhinorrhea and sore throat.   Eyes: Negative for redness.  Respiratory: Negative for cough.   Cardiovascular: Negative for chest pain.  Gastrointestinal: Positive for nausea and vomiting. Negative for abdominal pain, blood in stool and diarrhea.       Negative for hematemesis  Genitourinary: Negative for dysuria.  Musculoskeletal: Negative for myalgias.  Skin: Negative for rash.  Neurological: Negative for light-headedness.    Physical Exam Updated Vital Signs BP 118/69   Pulse 67   Temp 98.5 F  (36.9 C) (Oral)   Resp 14   Ht 5\' 10"  (1.778 m)   Wt 120 kg   SpO2 100%   BMI 37.96 kg/m   Physical Exam Vitals and nursing note reviewed.  Constitutional:      Appearance: He is well-developed.  HENT:     Head: Normocephalic and atraumatic.  Eyes:     General:        Right eye: No discharge.        Left eye: No discharge.     Conjunctiva/sclera: Conjunctivae normal.  Cardiovascular:     Rate and Rhythm: Normal rate and regular rhythm.     Heart sounds: Normal heart sounds.  Pulmonary:     Effort: Pulmonary effort is normal.     Breath sounds: Normal breath sounds.  Abdominal:     Palpations: Abdomen is soft.     Tenderness: There is no abdominal tenderness. There is no guarding or rebound.  Musculoskeletal:     Cervical back: Normal range of motion and neck supple.  Skin:    General: Skin is warm and dry.  Neurological:     Mental Status: He is alert.     ED Results / Procedures / Treatments   Labs (all labs ordered are listed, but only abnormal results are displayed) Labs Reviewed  COMPREHENSIVE METABOLIC PANEL - Abnormal; Notable for the following components:      Result Value  CO2 21 (*)    Glucose, Bld 148 (*)    Total Protein 8.3 (*)    Anion gap 16 (*)    All other components within normal limits  CBC - Abnormal; Notable for the following components:   WBC 18.2 (*)    MCV 77.1 (*)    MCH 23.1 (*)    MCHC 29.9 (*)    All other components within normal limits  URINALYSIS, ROUTINE W REFLEX MICROSCOPIC - Abnormal; Notable for the following components:   APPearance TURBID (*)    Specific Gravity, Urine 1.032 (*)    Ketones, ur 5 (*)    Protein, ur 100 (*)    Bacteria, UA RARE (*)    All other components within normal limits  LIPASE, BLOOD    EKG None  Radiology No results found.  Procedures Procedures (including critical care time)  Medications Ordered in ED Medications  sodium chloride 0.9 % bolus 1,000 mL (1,000 mLs Intravenous New  Bag/Given 08/09/20 0649)  ondansetron (ZOFRAN) injection 4 mg (4 mg Intravenous Given 08/09/20 5732)    ED Course  I have reviewed the triage vital signs and the nursing notes.  Pertinent labs & imaging results that were available during my care of the patient were reviewed by me and considered in my medical decision making (see chart for details).  Patient seen and examined.  Reviewed initial work-up with patient at bedside.  His exam is reassuring.  No reproducible tenderness on exam.  Not actively vomiting.  White blood cell count is elevated.  Slightly elevated blood sugar, no history of diabetes under do not suspect DKA.  Patient will be hydrated and given dose of Zofran.  Will reassess.  Vital signs reviewed and are as follows: BP 118/69   Pulse 67   Temp 98.5 F (36.9 C) (Oral)   Resp 14   Ht 5\' 10"  (1.778 m)   Wt 120 kg   SpO2 100%   BMI 37.96 kg/m   8:08 AM Pt rechecked. He has been able to drink half a cup of liquids without problem. I gave him some crackers. He states that he is feeling better.   8:20 AM Patient now vomiting.   9:24 AM patient recheck after Phenergan.  He is again feeling improved.  Encouraged small sips of fluids.  Will reassess.  10:40 AM patient continues to improve.  States that he is feeling good.  Requesting discharge to home.  Plan: Discharge with Rx Phenergan, clear liquids for the next 12 to 24 hours, bland brat diet afterwards.   The patient was urged to return to the Emergency Department immediately with worsening of current symptoms, worsening abdominal pain, persistent vomiting, blood noted in stools, fever, or any other concerns. The patient verbalized understanding.     MDM Rules/Calculators/A&P                          Pt here with N/V, no significant abd pain. Vitals are stable, no fever. Labs reassuring, elevated WBC suspected 2/2 vomiting. CBG slightly elevated but minimal gap, UA dehydration.  Imaging not felt indicated. No signs  of dehydration, now patient is tolerating PO's. Lungs are clear and no signs suggestive of PNA. Low concern for appendicitis, cholecystitis, pancreatitis, ruptured viscus, UTI, kidney stone, aortic dissection, aortic aneurysm or other emergent abdominal etiology. Supportive therapy indicated with return if symptoms worsen.   Final Clinical Impression(s) / ED Diagnoses Final diagnoses:  Non-intractable vomiting with  nausea, unspecified vomiting type    Rx / DC Orders ED Discharge Orders         Ordered    promethazine (PHENERGAN) 25 MG tablet  Every 6 hours PRN        08/09/20 1039           Renne Crigler, PA-C 08/09/20 1043    Arby Barrette, MD 08/13/20 1327

## 2020-10-01 ENCOUNTER — Emergency Department (HOSPITAL_COMMUNITY): Admission: EM | Admit: 2020-10-01 | Payer: Medicaid Other | Source: Home / Self Care

## 2020-10-02 ENCOUNTER — Other Ambulatory Visit: Payer: Self-pay

## 2020-10-02 ENCOUNTER — Encounter (HOSPITAL_COMMUNITY): Payer: Self-pay

## 2020-10-02 ENCOUNTER — Emergency Department (HOSPITAL_COMMUNITY)
Admission: EM | Admit: 2020-10-02 | Discharge: 2020-10-02 | Disposition: A | Discharge status: Home / Self Care | Payer: Medicaid Other | Attending: Emergency Medicine | Admitting: Emergency Medicine

## 2020-10-02 DIAGNOSIS — D72829 Elevated white blood cell count, unspecified: Secondary | ICD-10-CM | POA: Insufficient documentation

## 2020-10-02 DIAGNOSIS — R112 Nausea with vomiting, unspecified: Secondary | ICD-10-CM

## 2020-10-02 DIAGNOSIS — R197 Diarrhea, unspecified: Secondary | ICD-10-CM | POA: Insufficient documentation

## 2020-10-02 DIAGNOSIS — R10817 Generalized abdominal tenderness: Secondary | ICD-10-CM | POA: Insufficient documentation

## 2020-10-02 DIAGNOSIS — Z20822 Contact with and (suspected) exposure to covid-19: Secondary | ICD-10-CM | POA: Insufficient documentation

## 2020-10-02 DIAGNOSIS — F17200 Nicotine dependence, unspecified, uncomplicated: Secondary | ICD-10-CM | POA: Insufficient documentation

## 2020-10-02 DIAGNOSIS — J029 Acute pharyngitis, unspecified: Secondary | ICD-10-CM | POA: Insufficient documentation

## 2020-10-02 LAB — COMPREHENSIVE METABOLIC PANEL
ALT: 26 U/L (ref 0–44)
AST: 19 U/L (ref 15–41)
Albumin: 4.4 g/dL (ref 3.5–5.0)
Alkaline Phosphatase: 95 U/L (ref 38–126)
Anion gap: 17 — ABNORMAL HIGH (ref 5–15)
BUN: 15 mg/dL (ref 6–20)
CO2: 23 mmol/L (ref 22–32)
Calcium: 9.9 mg/dL (ref 8.9–10.3)
Chloride: 95 mmol/L — ABNORMAL LOW (ref 98–111)
Creatinine, Ser: 1.12 mg/dL (ref 0.61–1.24)
GFR, Estimated: >60 mL/min (ref >60)
Glucose, Bld: 132 mg/dL — ABNORMAL HIGH (ref 70–99)
Potassium: 3.4 mmol/L — ABNORMAL LOW (ref 3.5–5.1)
Sodium: 135 mmol/L (ref 135–145)
Total Bilirubin: 1 mg/dL (ref 0.3–1.2)
Total Protein: 9.5 g/dL — ABNORMAL HIGH (ref 6.5–8.1)

## 2020-10-02 LAB — RAPID HIV SCREEN (HIV 1/2 AB+AG)
HIV 1/2 Antibodies: NONREACTIVE
HIV-1 P24 Antigen - HIV24: NONREACTIVE

## 2020-10-02 LAB — CBC WITH DIFFERENTIAL/PLATELET
Abs Immature Granulocytes: 0.08 10*3/uL — ABNORMAL HIGH (ref 0.00–0.07)
Basophils Absolute: 0 10*3/uL (ref 0.0–0.1)
Basophils Relative: 0 %
Eosinophils Absolute: 0 10*3/uL (ref 0.0–0.5)
Eosinophils Relative: 0 %
HCT: 44.2 % (ref 39.0–52.0)
Hemoglobin: 13.6 g/dL (ref 13.0–17.0)
Immature Granulocytes: 0 %
Lymphocytes Relative: 18 %
Lymphs Abs: 3.7 10*3/uL (ref 0.7–4.0)
MCH: 23.3 pg — ABNORMAL LOW (ref 26.0–34.0)
MCHC: 30.8 g/dL (ref 30.0–36.0)
MCV: 75.8 fL — ABNORMAL LOW (ref 80.0–100.0)
Monocytes Absolute: 1 10*3/uL (ref 0.1–1.0)
Monocytes Relative: 5 %
Neutro Abs: 16.1 10*3/uL — ABNORMAL HIGH (ref 1.7–7.7)
Neutrophils Relative %: 77 %
Platelets: 255 10*3/uL (ref 150–400)
RBC: 5.83 MIL/uL — ABNORMAL HIGH (ref 4.22–5.81)
RDW: 14 % (ref 11.5–15.5)
WBC: 21 10*3/uL — ABNORMAL HIGH (ref 4.0–10.5)
nRBC: 0 % (ref 0.0–0.2)

## 2020-10-02 LAB — MONONUCLEOSIS SCREEN: Mono Screen: NEGATIVE

## 2020-10-02 LAB — RESP PANEL BY RT-PCR (FLU A&B, COVID) ARPGX2
Influenza A by PCR: NEGATIVE
Influenza B by PCR: NEGATIVE
SARS Coronavirus 2 by RT PCR: NEGATIVE

## 2020-10-02 LAB — GROUP A STREP BY PCR: Group A Strep by PCR: NOT DETECTED

## 2020-10-02 LAB — LIPASE, BLOOD: Lipase: 22 U/L (ref 11–51)

## 2020-10-02 MED ORDER — ONDANSETRON HCL 4 MG/2ML IJ SOLN
4.0000 mg | Freq: Once | INTRAMUSCULAR | Status: AC
  Administered 2020-10-02: 4 mg via INTRAVENOUS
  Filled 2020-10-02: qty 2

## 2020-10-02 MED ORDER — ONDANSETRON 4 MG PO TBDP: 4.0000 mg | ORAL_TABLET | Freq: Three times a day (TID) | ORAL | 0 refills | Status: DC | PRN

## 2020-10-02 MED ORDER — SODIUM CHLORIDE 0.9 % IV BOLUS
1000.0000 mL | Freq: Once | INTRAVENOUS | Status: AC
  Administered 2020-10-02: 1000 mL via INTRAVENOUS

## 2020-10-02 NOTE — Discharge Instructions (Signed)
Drink lots of fluids to avoid dehydration. Take Zofran for nausea as directed, as needed.   Return to the emergency department with any new or worsening symptoms.

## 2020-10-02 NOTE — ED Provider Notes (Signed)
[facility]-EMERGENCY DEPT Provider Note   CSN: [id number] Arrival date & time: 10/02/20  0011     History Chief Complaint  Patient presents with  . Nausea    [facility] is a 24 y.o. male.  Patient to ED with complaint of sore throat, nausea, vomiting, mild diarrhea. Symptoms started 2 days ago. He feels hot then cold, but has not measured his temperature. No hematemesis. He reports a history of strep throat, denies regular marijuana use. No sick contacts. No congestion, cough. Urinating normally.   The history is provided by the patient. No language interpreter was used.       History reviewed. No pertinent past medical history.  There are no problems to display for this patient.   History reviewed. No pertinent surgical history.     History reviewed. No pertinent family history.  Social History   Tobacco Use  . Smoking status: Current Some Day Smoker  . Smokeless tobacco: Never Used  Substance Use Topics  . Alcohol use: Yes  . Drug use: Yes    Frequency: 3.0 times per week    Types: Marijuana    Home Medications Prior to Admission medications   Medication Sig Start Date End Date Taking? Authorizing Provider  promethazine (PHENERGAN) 25 MG tablet Take 1 tablet (25 mg total) by mouth every 6 (six) hours as needed for nausea or vomiting. 08/09/20   Renne Crigler, PA-C    Allergies    Patient has no known allergies.  Review of Systems   Review of Systems  Constitutional: Positive for chills and diaphoresis (with vomiting episodes).  HENT: Positive for sore throat and trouble swallowing (pain). Negative for congestion.   Respiratory: Negative.  Negative for cough and shortness of breath.   Cardiovascular: Negative.  Negative for chest pain.  Gastrointestinal: Positive for abdominal pain (Epigastric), diarrhea, nausea and vomiting.  Genitourinary: Negative for decreased urine volume.  Musculoskeletal: Negative.  Negative for myalgias.   Skin: Negative.  Negative for rash.  Neurological: Negative.  Negative for weakness and headaches.    Physical Exam Updated Vital Signs BP (!) 156/104 (BP Location: Right Arm)   Pulse 63   Temp 98.7 F (37.1 C) (Oral)   Resp 18   Ht 5\' 10"  (1.778 m)   Wt 97.5 kg   SpO2 100%   BMI 30.85 kg/m   Physical Exam Vitals and nursing note reviewed.  Constitutional:      General: He is not in acute distress.    Appearance: Normal appearance.  HENT:     Head: Normocephalic.     Mouth/Throat:     Mouth: Mucous membranes are moist.     Comments: Tonsils swollen bilaterally with erythema but no exudates. Uvula midline. Cardiovascular:     Rate and Rhythm: Normal rate and regular rhythm.     Heart sounds: No murmur heard.   Pulmonary:     Effort: Pulmonary effort is normal.     Breath sounds: No wheezing, rhonchi or rales.  Abdominal:     General: There is no distension.     Palpations: Abdomen is soft.     Tenderness: There is abdominal tenderness (Limited to epigastric area.).  Musculoskeletal:        General: Normal range of motion.     Cervical back: Normal range of motion and neck supple.  Skin:    General: Skin is warm and dry.  Neurological:     General: No focal deficit present.  Mental Status: He is alert and oriented to person, place, and time.     ED Results / Procedures / Treatments   Labs (all labs ordered are listed, but only abnormal results are displayed) Labs Reviewed - No data to display Results for orders placed or performed during the hospital encounter of 10/02/20  Group A Strep by PCR   Specimen: Throat; Sterile Swab  Result Value Ref Range   Group A Strep by PCR NOT DETECTED NOT DETECTED  Resp Panel by RT-PCR (Flu A&B, Covid) Nasopharyngeal Swab   Specimen: Nasopharyngeal Swab; Nasopharyngeal(NP) swabs in vial transport medium  Result Value Ref Range   SARS Coronavirus 2 by RT PCR NEGATIVE NEGATIVE   Influenza A by PCR NEGATIVE NEGATIVE    Influenza B by PCR NEGATIVE NEGATIVE  CBC with Differential  Result Value Ref Range   WBC 21.0 (H) 4.0 - 10.5 K/uL   RBC 5.83 (H) 4.22 - 5.81 MIL/uL   Hemoglobin 13.6 13.0 - 17.0 g/dL   HCT 14.1 03.0 - 13.1 %   MCV 75.8 (L) 80.0 - 100.0 fL   MCH 23.3 (L) 26.0 - 34.0 pg   MCHC 30.8 30.0 - 36.0 g/dL   RDW 43.8 88.7 - 57.9 %   Platelets 255 150 - 400 K/uL   nRBC 0.0 0.0 - 0.2 %   Neutrophils Relative % 77 %   Neutro Abs 16.1 (H) 1.7 - 7.7 K/uL   Lymphocytes Relative 18 %   Lymphs Abs 3.7 0.7 - 4.0 K/uL   Monocytes Relative 5 %   Monocytes Absolute 1.0 0.1 - 1.0 K/uL   Eosinophils Relative 0 %   Eosinophils Absolute 0.0 0.0 - 0.5 K/uL   Basophils Relative 0 %   Basophils Absolute 0.0 0.0 - 0.1 K/uL   Immature Granulocytes 0 %   Abs Immature Granulocytes 0.08 (H) 0.00 - 0.07 K/uL  Comprehensive metabolic panel  Result Value Ref Range   Sodium 135 135 - 145 mmol/L   Potassium 3.4 (L) 3.5 - 5.1 mmol/L   Chloride 95 (L) 98 - 111 mmol/L   CO2 23 22 - 32 mmol/L   Glucose, Bld 132 (H) 70 - 99 mg/dL   BUN 15 6 - 20 mg/dL   Creatinine, Ser 7.28 0.61 - 1.24 mg/dL   Calcium 9.9 8.9 - 20.6 mg/dL   Total Protein 9.5 (H) 6.5 - 8.1 g/dL   Albumin 4.4 3.5 - 5.0 g/dL   AST 19 15 - 41 U/L   ALT 26 0 - 44 U/L   Alkaline Phosphatase 95 38 - 126 U/L   Total Bilirubin 1.0 0.3 - 1.2 mg/dL   GFR, Estimated >01 >56 mL/min   Anion gap 17 (H) 5 - 15  Lipase, blood  Result Value Ref Range   Lipase 22 11 - 51 U/L  Rapid HIV screen (HIV 1/2 Ab+Ag)  Result Value Ref Range   HIV-1 P24 Antigen - HIV24 NON REACTIVE NON REACTIVE   HIV 1/2 Antibodies NON REACTIVE NON REACTIVE   Interpretation (HIV Ag Ab)      A non reactive test result means that HIV 1 or HIV 2 antibodies and HIV 1 p24 antigen were not detected in the specimen.  Mononucleosis screen  Result Value Ref Range   Mono Screen NEGATIVE NEGATIVE    EKG None  Radiology No results found.  Procedures Procedures (including critical care  time)  Medications Ordered in ED Medications  sodium chloride 0.9 % bolus 1,000 mL (has no  administration in time range)  ondansetron (ZOFRAN) injection 4 mg (has no administration in time range)    ED Course  I have reviewed the triage vital signs and the nursing notes.  Pertinent labs & imaging results that were available during my care of the patient were reviewed by me and considered in my medical decision making (see chart for details).    MDM Rules/Calculators/A&P                          Patient to ED with ST, N, V, D x 2 days.   He is well appearing. Mild hypertension otherwise stable VS. Labs ordered/pending. Will give IVF's, IV Zofran and continue to observe.   Chart reviewed. He has had multiple visits for tonsillitis, N, V in the past.   Recheck after Zofran, patient awake, no further vomiting, appears more comfortable. Labs concerning for leukocytosis of 21. His abdominal exam is benign. Do not feel imaging is indicated. Condition improving. Will add Mono screen, HIV, COVID/viral panel. Additional fluids given (2000 cc total) for mild dehydration.   No further vomiting. Patient is sleeping soundly. VSS. Tests are negative for HIV, COVID, Mono, strep, flu.   He can be discharged home. He is encouraged to follow up with a primary care provider. Rx Zofran provided.     Final Clinical Impression(s) / ED Diagnoses Final diagnoses:  None   1. Nausea and vomiting 2. Leukocytosis  Rx / DC Orders ED Discharge Orders    None       Elpidio Anis, PA-C 10/02/20 0407    Sabas Sous, MD 10/02/20 [phone]

## 2020-10-02 NOTE — ED Triage Notes (Signed)
Pt states that he has been throwing up and wanting to throw up since Friday.Taking medications for swollen tonsils (throat spray). Also complains of sweating.

## 2020-10-02 NOTE — ED Notes (Signed)
PO challenge complete. Patient provided sprite and crackers, tolerated well.

## 2020-10-03 ENCOUNTER — Emergency Department (HOSPITAL_COMMUNITY): Payer: Self-pay

## 2020-10-03 ENCOUNTER — Encounter (HOSPITAL_COMMUNITY): Payer: Self-pay

## 2020-10-03 ENCOUNTER — Other Ambulatory Visit: Payer: Self-pay

## 2020-10-03 ENCOUNTER — Emergency Department (HOSPITAL_COMMUNITY)
Admission: EM | Admit: 2020-10-03 | Discharge: 2020-10-03 | Disposition: A | Discharge status: Home / Self Care | Payer: Self-pay | Attending: Emergency Medicine | Admitting: Emergency Medicine

## 2020-10-03 DIAGNOSIS — R509 Fever, unspecified: Secondary | ICD-10-CM | POA: Insufficient documentation

## 2020-10-03 DIAGNOSIS — R112 Nausea with vomiting, unspecified: Secondary | ICD-10-CM | POA: Insufficient documentation

## 2020-10-03 DIAGNOSIS — R1084 Generalized abdominal pain: Secondary | ICD-10-CM | POA: Insufficient documentation

## 2020-10-03 DIAGNOSIS — Z20822 Contact with and (suspected) exposure to covid-19: Secondary | ICD-10-CM | POA: Insufficient documentation

## 2020-10-03 DIAGNOSIS — F17200 Nicotine dependence, unspecified, uncomplicated: Secondary | ICD-10-CM | POA: Insufficient documentation

## 2020-10-03 LAB — COMPREHENSIVE METABOLIC PANEL
ALT: 30 U/L (ref 0–44)
AST: 30 U/L (ref 15–41)
Albumin: 4.1 g/dL (ref 3.5–5.0)
Alkaline Phosphatase: 92 U/L (ref 38–126)
Anion gap: 13 (ref 5–15)
BUN: 11 mg/dL (ref 6–20)
CO2: 25 mmol/L (ref 22–32)
Calcium: 9.7 mg/dL (ref 8.9–10.3)
Chloride: 98 mmol/L (ref 98–111)
Creatinine, Ser: 1.23 mg/dL (ref 0.61–1.24)
GFR, Estimated: >60 mL/min (ref >60)
Glucose, Bld: 133 mg/dL — ABNORMAL HIGH (ref 70–99)
Potassium: 3.4 mmol/L — ABNORMAL LOW (ref 3.5–5.1)
Sodium: 136 mmol/L (ref 135–145)
Total Bilirubin: 0.6 mg/dL (ref 0.3–1.2)
Total Protein: 8.3 g/dL — ABNORMAL HIGH (ref 6.5–8.1)

## 2020-10-03 LAB — CBC WITH DIFFERENTIAL/PLATELET
Abs Immature Granulocytes: 0.08 10*3/uL — ABNORMAL HIGH (ref 0.00–0.07)
Basophils Absolute: 0 10*3/uL (ref 0.0–0.1)
Basophils Relative: 0 %
Eosinophils Absolute: 0 10*3/uL (ref 0.0–0.5)
Eosinophils Relative: 0 %
HCT: 39.7 % (ref 39.0–52.0)
Hemoglobin: 12.2 g/dL — ABNORMAL LOW (ref 13.0–17.0)
Immature Granulocytes: 1 %
Lymphocytes Relative: 15 %
Lymphs Abs: 2.4 10*3/uL (ref 0.7–4.0)
MCH: 23.5 pg — ABNORMAL LOW (ref 26.0–34.0)
MCHC: 30.7 g/dL (ref 30.0–36.0)
MCV: 76.5 fL — ABNORMAL LOW (ref 80.0–100.0)
Monocytes Absolute: 1.1 10*3/uL — ABNORMAL HIGH (ref 0.1–1.0)
Monocytes Relative: 7 %
Neutro Abs: 13 10*3/uL — ABNORMAL HIGH (ref 1.7–7.7)
Neutrophils Relative %: 77 %
Platelets: 237 10*3/uL (ref 150–400)
RBC: 5.19 MIL/uL (ref 4.22–5.81)
RDW: 14 % (ref 11.5–15.5)
WBC: 16.6 10*3/uL — ABNORMAL HIGH (ref 4.0–10.5)
nRBC: 0 % (ref 0.0–0.2)

## 2020-10-03 LAB — RESP PANEL BY RT-PCR (FLU A&B, COVID) ARPGX2
Influenza A by PCR: NEGATIVE
Influenza B by PCR: NEGATIVE
SARS Coronavirus 2 by RT PCR: NEGATIVE

## 2020-10-03 LAB — URINALYSIS, ROUTINE W REFLEX MICROSCOPIC
Bacteria, UA: NONE SEEN
Bilirubin Urine: NEGATIVE
Glucose, UA: NEGATIVE mg/dL
Hgb urine dipstick: NEGATIVE
Ketones, ur: 5 mg/dL — AB
Leukocytes,Ua: NEGATIVE
Nitrite: NEGATIVE
Protein, ur: 30 mg/dL — AB
Specific Gravity, Urine: >1.046 — ABNORMAL HIGH (ref 1.005–1.030)
pH: 8 (ref 5.0–8.0)

## 2020-10-03 LAB — LACTIC ACID, PLASMA
Lactic Acid, Venous: 2.6 mmol/L (ref 0.5–1.9)
Lactic Acid, Venous: 1.3 mmol/L (ref 0.5–1.9)

## 2020-10-03 LAB — GROUP A STREP BY PCR: Group A Strep by PCR: NOT DETECTED

## 2020-10-03 LAB — LIPASE, BLOOD: Lipase: 25 U/L (ref 11–51)

## 2020-10-03 MED ORDER — FENTANYL CITRATE (PF) 100 MCG/2ML IJ SOLN
50.0000 ug | Freq: Once | INTRAMUSCULAR | Status: AC
  Administered 2020-10-03: 14:00:00 50 ug via INTRAVENOUS
  Filled 2020-10-03: qty 2

## 2020-10-03 MED ORDER — ONDANSETRON HCL 4 MG/2ML IJ SOLN
4.0000 mg | Freq: Once | INTRAMUSCULAR | Status: AC
  Administered 2020-10-03: 14:00:00 4 mg via INTRAVENOUS
  Filled 2020-10-03: qty 2

## 2020-10-03 MED ORDER — PROMETHAZINE HCL 50 MG PO TABS: 25.0000 mg | ORAL_TABLET | Freq: Four times a day (QID) | ORAL | 0 refills | Status: DC | PRN

## 2020-10-03 MED ORDER — ACETAMINOPHEN 325 MG PO TABS
650.0000 mg | ORAL_TABLET | Freq: Once | ORAL | Status: AC | PRN
  Administered 2020-10-03: 650 mg via ORAL
  Filled 2020-10-03: qty 2

## 2020-10-03 MED ORDER — SODIUM CHLORIDE 0.9 % IV BOLUS
1000.0000 mL | Freq: Once | INTRAVENOUS | Status: AC
  Administered 2020-10-03: 14:00:00 1000 mL via INTRAVENOUS

## 2020-10-03 MED ORDER — IOHEXOL 300 MG/ML  SOLN
100.0000 mL | Freq: Once | INTRAMUSCULAR | Status: AC | PRN
  Administered 2020-10-03: 15:00:00 100 mL via INTRAVENOUS

## 2020-10-03 MED ORDER — ONDANSETRON 4 MG PO TBDP
4.0000 mg | ORAL_TABLET | Freq: Once | ORAL | Status: AC | PRN
  Administered 2020-10-03: 12:00:00 4 mg via ORAL
  Filled 2020-10-03: qty 1

## 2020-10-03 NOTE — Discharge Instructions (Addendum)
You were seen in the emergency department for continued sore throat, nausea and vomiting  Work-up today not reveal any concerning abnormalities. We suspect you probably have a viral illness. Also, consider cutting back on marijuana use as this can cause bouts of uncontrollable nausea, vomiting, abdominal pain leading to dehydration.  Take promethazine every 6 hours for nausea. Stay hydrated. Alternate ibuprofen and acetaminophen for sore throat and fevers.  Return to the ED for worsening or persistent symptoms despite medicines above

## 2020-10-03 NOTE — ED Provider Notes (Signed)
[facility]-EMERGENCY DEPT Provider Note   CSN: [id number] Arrival date & time: 10/03/20  1122     History Chief Complaint  Patient presents with  . Fever  . Sore Throat  . Emesis    Jason Hess is a 24 y.o. male who presents for evaluation of nausea/vomiting, subjective fever chills, abdominal pain, sore throat that has been ongoing for the last 4 days.  He was seen here on 10/02/2020 for evaluation of his symptoms.  At that time, his work-up was unremarkable and he was discharged home with nausea medication.  He states that despite taking the nausea medication, he has continued to vomit and has not been able to keep anything down.  He states he is continue to have subjective fever chills but he is not actually measured it with a thermometer at home.  He states he feels like he is having trouble breathing because he is vomiting.  He states that he will start vomiting and then start coughing and feels like he cannot catch his breath.  Emesis is nonbloody, nonbilious.  He reports some generalized abdominal pain with no focal point.  He states he is not really been able to eat much.  He states that he has not any diarrhea.  He states that his throat actually feels better than it was a couple days ago.  He has not taken any medication for it.  He has been able to tolerate his saliva and tolerate secretions.  He is also been able to tolerate fluids but states that he will then vomit.  He has not been vaccinated for Covid.  No Covid exposure that he knows of.  He does not smoke cigarettes but states he occasionally will use marijuana.  His last use of marijuana was yesterday.  The history is provided by the patient.       History reviewed. No pertinent past medical history.  There are no problems to display for this patient.   History reviewed. No pertinent surgical history.     History reviewed. No pertinent family history.  Social History   Tobacco Use  . Smoking  status: Current Some Day Smoker  . Smokeless tobacco: Never Used  Substance Use Topics  . Alcohol use: Yes  . Drug use: Yes    Frequency: 3.0 times per week    Types: Marijuana    Home Medications Prior to Admission medications   Medication Sig Start Date End Date Taking? Authorizing Provider  Acetaminophen (CHLORASEPTIC SORE THROAT PO) Take 1-2 sprays by mouth daily as needed (sore throat).   Yes [provider]  acetaminophen (TYLENOL) 325 MG tablet Take 650 mg by mouth every 6 (six) hours as needed for mild pain, fever or headache.   Yes [provider]  ondansetron (ZOFRAN ODT) 4 MG disintegrating tablet Take 1 tablet (4 mg total) by mouth every 8 (eight) hours as needed for nausea or vomiting. 10/02/20  Yes Upstill, Melvenia Beam, PA-C  promethazine (PHENERGAN) 25 MG tablet Take 1 tablet (25 mg total) by mouth every 6 (six) hours as needed for nausea or vomiting. Patient not taking: No sig reported 08/09/20   Renne Crigler, PA-C    Allergies    Patient has no known allergies.  Review of Systems   Review of Systems  Constitutional: Positive for fever.  HENT: Positive for sore throat. Negative for trouble swallowing.   Respiratory: Negative for cough and shortness of breath.   Cardiovascular: Negative for chest pain.  Gastrointestinal: Positive  for abdominal pain, nausea and vomiting. Negative for diarrhea.  Genitourinary: Negative for dysuria and hematuria.  Neurological: Negative for headaches.  All other systems reviewed and are negative.   Physical Exam Updated Vital Signs BP (!) 162/91   Pulse (!) 51   Temp 98.5 F (36.9 C) (Oral)   Resp 16   SpO2 99%   Physical Exam Vitals and nursing note reviewed.  Constitutional:      Appearance: Normal appearance. He is well-developed and well-nourished.  HENT:     Head: Normocephalic and atraumatic.     Mouth/Throat:     Mouth: Oropharynx is clear and moist and mucous membranes are normal.     Comments:  Posterior pharynx is erythematous, edematous with exudates noted.  Uvula is midline.  Airways patent, phonation is intact. Eyes:     General: Lids are normal.     Extraocular Movements: EOM normal.     Conjunctiva/sclera: Conjunctivae normal.     Pupils: Pupils are equal, round, and reactive to light.  Cardiovascular:     Rate and Rhythm: Normal rate and regular rhythm.     Pulses: Normal pulses.     Heart sounds: Normal heart sounds. No murmur heard. No friction rub. No gallop.   Pulmonary:     Effort: Pulmonary effort is normal.     Breath sounds: Normal breath sounds.     Comments: Lungs clear to auscultation bilaterally.  Symmetric chest rise.  No wheezing, rales, rhonchi. Abdominal:     Palpations: Abdomen is soft. Abdomen is not rigid.     Tenderness: There is generalized abdominal tenderness. There is no guarding.     Comments: Abdomen soft, nondistended.  Generalized tenderness no focal pain.  No rigidity, guarding.  No CVA tenderness noted bilaterally.  No focal tenderness noted.  Point.  Musculoskeletal:        General: Normal range of motion.     Cervical back: Full passive range of motion without pain.  Skin:    General: Skin is warm and dry.     Capillary Refill: Capillary refill takes less than 2 seconds.  Neurological:     Mental Status: He is alert and oriented to person, place, and time.  Psychiatric:        Mood and Affect: Mood and affect normal.        Speech: Speech normal.     ED Results / Procedures / Treatments   Labs (all labs ordered are listed, but only abnormal results are displayed) Labs Reviewed  LACTIC ACID, PLASMA - Abnormal; Notable for the following components:      Result Value   Lactic Acid, Venous 2.6 (*)    All other components within normal limits  COMPREHENSIVE METABOLIC PANEL - Abnormal; Notable for the following components:   Potassium 3.4 (*)    Glucose, Bld 133 (*)    Total Protein 8.3 (*)    All other components within normal  limits  CBC WITH DIFFERENTIAL/PLATELET - Abnormal; Notable for the following components:   WBC 16.6 (*)    Hemoglobin 12.2 (*)    MCV 76.5 (*)    MCH 23.5 (*)    Neutro Abs 13.0 (*)    Monocytes Absolute 1.1 (*)    Abs Immature Granulocytes 0.08 (*)    All other components within normal limits  RESP PANEL BY RT-PCR (FLU A&B, COVID) [id number]  GROUP A STREP BY PCR  LACTIC ACID, PLASMA  LIPASE, BLOOD  URINALYSIS, ROUTINE W REFLEX MICROSCOPIC  EKG None  Radiology CT Abdomen Pelvis W Contrast  Result Date: 10/03/2020 CLINICAL DATA:  Acute nonlocalized abdominal pain. Vomiting and fever. Sore throat. EXAM: CT ABDOMEN AND PELVIS WITH CONTRAST TECHNIQUE: Multidetector CT imaging of the abdomen and pelvis was performed using the standard protocol following bolus administration of intravenous contrast. CONTRAST:  OMNIPAQUE IOHEXOL 300 MG/ML  SOLN COMPARISON:  Chest x-ray 10/03/2020 FINDINGS: Lower chest: No acute abnormality. Hepatobiliary: No focal liver abnormality. No gallstones, gallbladder wall thickening, or pericholecystic fluid. No biliary dilatation. Pancreas: Vague hypodensity along the false form ligament likely represents focal fatty infiltration. Otherwise no focal lesion. Normal pancreatic contour. No surrounding inflammatory changes. No main pancreatic ductal dilatation. Spleen: Normal in size without focal abnormality. Adrenals/Urinary Tract: No adrenal nodule bilaterally. Bilateral kidneys enhance symmetrically. No hydronephrosis. No hydroureter. The urinary bladder is unremarkable. Stomach/Bowel: Stomach is within normal limits. No evidence of bowel wall thickening or dilatation. Appendix appears normal. Vascular/Lymphatic: No abdominal aorta or iliac aneurysm. No abdominal, pelvic, or inguinal lymphadenopathy. Reproductive: Prostate is unremarkable. Other: No intraperitoneal free fluid. No intraperitoneal free gas. No organized fluid collection. Musculoskeletal: No abdominal  wall hernia or abnormality No suspicious lytic or blastic osseous lesions. No acute displaced fracture. IMPRESSION: No acute intra-abdominal or intrapelvic abnormality. Electronically Signed   By: Tish Frederickson M.D.   On: 10/03/2020 15:11   DG Chest Portable 1 View  Result Date: 10/03/2020 CLINICAL DATA:  Fever EXAM: PORTABLE CHEST 1 VIEW COMPARISON:  None. FINDINGS: No focal consolidation. No pneumothorax or pleural effusion. Cardiomediastinal silhouette is within normal limits. No acute osseous abnormality. IMPRESSION: No focal airspace disease. Electronically Signed   By: Stana Bunting M.D.   On: 10/03/2020 14:37    Procedures Procedures (including critical care time)  Medications Ordered in ED Medications  ondansetron (ZOFRAN-ODT) disintegrating tablet 4 mg (4 mg Oral Given 10/03/20 1142)  acetaminophen (TYLENOL) tablet 650 mg (650 mg Oral Given 10/03/20 1142)  sodium chloride 0.9 % bolus 1,000 mL (1,000 mLs Intravenous New Bag/Given 10/03/20 1357)  ondansetron (ZOFRAN) injection 4 mg (4 mg Intravenous Given 10/03/20 1351)  fentaNYL (SUBLIMAZE) injection 50 mcg (50 mcg Intravenous Given 10/03/20 1350)  iohexol (OMNIPAQUE) 300 MG/ML solution 100 mL (100 mLs Intravenous Contrast Given 10/03/20 1440)    ED Course  I have reviewed the triage vital signs and the nursing notes.  Pertinent labs & imaging results that were available during my care of the patient were reviewed by me and considered in my medical decision making (see chart for details).  Clinical Course as of 10/03/20 1539  Tue Oct 03, 2020  1527 5 days sore throat, nausea, vomiting, abd pain Seen 12/20, extensive work up then Extensive work up today - benign Pending PO challenge, UA, strep +marijuana  Likely discharge  [CG]    Clinical Course User Index [CG] Jerrell Mylar   MDM Rules/Calculators/A&P                          24 year old male who presents for evaluation of nausea/vomiting/abdominal  pain, fevers, sore throat.  Was seen here the other day for evaluation of symptoms and was discharged home.  He reports that he came back because he has continued to vomit.  He has not been vaccinated for Covid.  Denies any Covid exposure that he knows of.  He states his sore throat has been getting better.  He has been able tolerate secretions and tolerate p.o.  On initial  arrival, he is febrile, slightly hypertensive. Vitals otherwise stable.  On exam, no evidence of respiratory stress.  He has some mild tenderness noted abdomen.  We will plan to check labs, give fluids, analgesics.  Lactic is elevated at 2.6.  CMP shows potassium 3.4.  BUN and creatinine within normal limits.  Lipase is normal.  CBC shows slight leukocytosis at 16.6. This is actually down from previous visit.  Hemoglobin is 12.2. COVID is negative.  Given that he still vomiting and has fever, will plan CT ab pelvis for evaluation.  Chest x-ray negative for any pneumonia.  CT on pelvis shows no evidence of acute intra-abdominal pathology.  Patient signed out to Sharen Heck, PA-C with labs and PO challenge.   Jason Hess was evaluated in Emergency Department on 10/03/2020 for the symptoms described in the history of present illness. He was evaluated in the context of the global COVID-19 pandemic, which necessitated consideration that the patient might be at risk for infection with the SARS-CoV-2 virus that causes COVID-19. Institutional protocols and algorithms that pertain to the evaluation of patients at risk for COVID-19 are in a state of rapid change based on information released by regulatory bodies including the CDC and federal and state organizations. These policies and algorithms were followed during the patient's care in the ED.  Portions of this note were generated with Scientist, clinical (histocompatibility and immunogenetics). Dictation errors may occur despite best attempts at proofreading.   Final Clinical Impression(s) / ED Diagnoses Final diagnoses:   Fever, unspecified fever cause  Nausea and vomiting, intractability of vomiting not specified, unspecified vomiting type    Rx / DC Orders ED Discharge Orders    None       Rosana Hoes 10/03/20 1539    Gwyneth Sprout, MD 10/05/20 2206

## 2020-10-03 NOTE — ED Notes (Signed)
Pt swabbed for strep again, specimen not found in lab.

## 2020-10-03 NOTE — ED Triage Notes (Signed)
Pt reports vomiting , fever, and sore throat since Friday. Pt reports he was seen here Sunday night and states he was diagnosed with tonsillitis. Pt reports the sore throat and vomiting has become worse since then. Pt is febrile in triage.

## 2020-10-03 NOTE — ED Provider Notes (Signed)
  Physical Exam  BP (!) 162/102   Pulse 77   Temp 98.5 F (36.9 C) (Oral)   Resp 18   SpO2 99%    ED Course/Procedures   Clinical Course as of 10/03/20 1808  Tue Oct 03, 2020  1527 Transfer of care at shift change 5 days sore throat, nausea, vomiting, abd pain Seen 12/20, extensive work up then Returned for continued vomiting, throat pain improving  Extensive work up today - benign Pending PO challenge, UA, strep +marijuana  Likely discharge  [CG]  1805 Group A Strep by PCR: NOT DETECTED [CG]  1805 Specific Gravity, Urine(!): >1.046 [CG]  1805 Ketones, ur(!): 5 [CG]  1805 Protein(!): 30 [CG]  1805 WBC(!): 16.6 [CG]  1805 NEUT#(!): 13.0 [CG]  1805 Potassium(!): 3.4 [CG]    Clinical Course User Index [CG] Liberty Handy, PA-C    Procedures  MDM   [phone]: ER work up personally visualized and interpreted, remarkable as above. CTAP negative. CXR negative. Extensive work up benign today.   Patient tolerating PO. UA negative. Strep negative.   Will discharge with antiemetic, oral hydration. Likely viral illness vs marijuana. Seen several times in ER for nausea/vomiting.     Liberty Handy, PA-C 10/03/20 1809    Milagros Loll, MD 10/05/20 1455

## 2021-02-13 ENCOUNTER — Encounter (HOSPITAL_BASED_OUTPATIENT_CLINIC_OR_DEPARTMENT_OTHER): Payer: Self-pay

## 2021-02-13 ENCOUNTER — Other Ambulatory Visit: Payer: Self-pay

## 2021-02-13 ENCOUNTER — Emergency Department (HOSPITAL_BASED_OUTPATIENT_CLINIC_OR_DEPARTMENT_OTHER)
Admission: EM | Admit: 2021-02-13 | Discharge: 2021-02-13 | Disposition: A | Discharge status: Home / Self Care | Payer: Medicaid Other | Attending: Emergency Medicine | Admitting: Emergency Medicine

## 2021-02-13 DIAGNOSIS — R112 Nausea with vomiting, unspecified: Secondary | ICD-10-CM

## 2021-02-13 LAB — COMPREHENSIVE METABOLIC PANEL
ALT: 20 U/L (ref 0–44)
AST: 25 U/L (ref 15–41)
Albumin: 5 g/dL (ref 3.5–5.0)
Alkaline Phosphatase: 125 U/L (ref 38–126)
Anion gap: 14 (ref 5–15)
BUN: 13 mg/dL (ref 6–20)
CO2: 31 mmol/L (ref 22–32)
Calcium: 9.9 mg/dL (ref 8.9–10.3)
Chloride: 90 mmol/L — ABNORMAL LOW (ref 98–111)
Creatinine, Ser: 1.21 mg/dL (ref 0.61–1.24)
GFR, Estimated: >60 mL/min (ref >60)
Glucose, Bld: 141 mg/dL — ABNORMAL HIGH (ref 70–99)
Potassium: 3.2 mmol/L — ABNORMAL LOW (ref 3.5–5.1)
Sodium: 135 mmol/L (ref 135–145)
Total Bilirubin: 0.7 mg/dL (ref 0.3–1.2)
Total Protein: 9.5 g/dL — ABNORMAL HIGH (ref 6.5–8.1)

## 2021-02-13 LAB — URINALYSIS, ROUTINE W REFLEX MICROSCOPIC
Bilirubin Urine: NEGATIVE
Glucose, UA: NEGATIVE mg/dL
Ketones, ur: NEGATIVE mg/dL
Leukocytes,Ua: NEGATIVE
Nitrite: NEGATIVE
Protein, ur: 100 mg/dL — AB
Specific Gravity, Urine: 1.025 (ref 1.005–1.030)
pH: 6 (ref 5.0–8.0)

## 2021-02-13 LAB — CBC
HCT: 48.7 % (ref 39.0–52.0)
Hemoglobin: 14.7 g/dL (ref 13.0–17.0)
MCH: 23 pg — ABNORMAL LOW (ref 26.0–34.0)
MCHC: 30.2 g/dL (ref 30.0–36.0)
MCV: 76.2 fL — ABNORMAL LOW (ref 80.0–100.0)
Platelets: 255 10*3/uL (ref 150–400)
RBC: 6.39 MIL/uL — ABNORMAL HIGH (ref 4.22–5.81)
RDW: 13.9 % (ref 11.5–15.5)
WBC: 11.8 10*3/uL — ABNORMAL HIGH (ref 4.0–10.5)
nRBC: 0 % (ref 0.0–0.2)

## 2021-02-13 LAB — URINALYSIS, MICROSCOPIC (REFLEX): WBC, UA: NONE SEEN WBC/hpf (ref 0–5)

## 2021-02-13 LAB — LIPASE, BLOOD: Lipase: 38 U/L (ref 11–51)

## 2021-02-13 MED ORDER — POTASSIUM CHLORIDE CRYS ER 20 MEQ PO TBCR
40.0000 meq | EXTENDED_RELEASE_TABLET | Freq: Once | ORAL | Status: AC
  Administered 2021-02-13: 40 meq via ORAL
  Filled 2021-02-13: qty 2

## 2021-02-13 MED ORDER — SODIUM CHLORIDE 0.9 % IV BOLUS
1000.0000 mL | Freq: Once | INTRAVENOUS | Status: AC
  Administered 2021-02-13: 1000 mL via INTRAVENOUS

## 2021-02-13 MED ORDER — ONDANSETRON 4 MG PO TBDP
4.0000 mg | ORAL_TABLET | Freq: Once | ORAL | Status: AC | PRN
  Administered 2021-02-13: 4 mg via ORAL
  Filled 2021-02-13: qty 1

## 2021-02-13 NOTE — ED Notes (Signed)
Pt given Ginger Ale, water, and saltine crackers. DM

## 2021-02-13 NOTE — ED Provider Notes (Signed)
[facility] EMERGENCY DEPARTMENT Provider Note   CSN: [id number] Arrival date & time: 02/13/21  1542     History Chief Complaint  Patient presents with  . Emesis    Jason Hess is a 25 y.o. male with no significant past medical history who presents to the ED due to nausea and vomiting x2 days.  Patient midst to numerous episodes of nonbloody, nonbilious emesis.  He notes he drank alcohol Sunday and "has not fully recovered".  Admits to frequent marijuana use.  Nausea and vomiting associated with diffuse abdominal pain.  No previous abdominal operations.  Patient denies fever, chills, diarrhea.  No sick contacts or known COVID exposures.  No treatment prior to arrival.  No aggravating or alleviating symptoms.  History obtained from patient and past medical records. No interpreter used during encounter.      History reviewed. No pertinent past medical history.  There are no problems to display for this patient.   History reviewed. No pertinent surgical history.     History reviewed. No pertinent family history.  Social History   Tobacco Use  . Smoking status: Never Smoker  . Smokeless tobacco: Never Used  Substance Use Topics  . Alcohol use: Yes  . Drug use: Yes    Frequency: 3.0 times per week    Types: Marijuana    Home Medications Prior to Admission medications   Medication Sig Start Date End Date Taking? Authorizing Provider  Acetaminophen (CHLORASEPTIC SORE THROAT PO) Take 1-2 sprays by mouth daily as needed (sore throat).    [provider]  acetaminophen (TYLENOL) 325 MG tablet Take 650 mg by mouth every 6 (six) hours as needed for mild pain, fever or headache.    [provider]  ondansetron (ZOFRAN ODT) 4 MG disintegrating tablet Take 1 tablet (4 mg total) by mouth every 8 (eight) hours as needed for nausea or vomiting. 10/02/20   Elpidio Anis, PA-C  promethazine (PHENERGAN) 50 MG tablet Take 0.5 tablets (25 mg total) by mouth every 6  (six) hours as needed for nausea or vomiting. 10/03/20   Liberty Handy, PA-C    Allergies    Patient has no known allergies.  Review of Systems   Review of Systems  Constitutional: Negative for chills and fever.  Respiratory: Negative for shortness of breath.   Cardiovascular: Negative for chest pain.  Gastrointestinal: Positive for abdominal pain, nausea and vomiting. Negative for diarrhea.  Genitourinary: Negative for dysuria.    Physical Exam Updated Vital Signs BP 127/75   Pulse 61   Temp 98.4 F (36.9 C)   Resp 18   Ht 5\' 10"  (1.778 m)   Wt 103.9 kg   SpO2 100%   BMI 32.87 kg/m   Physical Exam Vitals and nursing note reviewed.  Constitutional:      General: He is not in acute distress.    Appearance: He is not ill-appearing.  HENT:     Head: Normocephalic.  Eyes:     Pupils: Pupils are equal, round, and reactive to light.  Cardiovascular:     Rate and Rhythm: Normal rate and regular rhythm.     Pulses: Normal pulses.     Heart sounds: Normal heart sounds. No murmur heard. No friction rub. No gallop.   Pulmonary:     Effort: Pulmonary effort is normal.     Breath sounds: Normal breath sounds.  Abdominal:     General: Abdomen is flat. There is no distension.     Palpations:  Abdomen is soft.     Tenderness: There is no abdominal tenderness. There is no guarding or rebound.     Comments: Abdomen soft, nondistended, nontender to palpation in all quadrants without guarding or peritoneal signs. No rebound.   Musculoskeletal:        General: Normal range of motion.     Cervical back: Neck supple.  Skin:    General: Skin is warm and dry.  Neurological:     General: No focal deficit present.     Mental Status: He is alert.  Psychiatric:        Mood and Affect: Mood normal.        Behavior: Behavior normal.     ED Results / Procedures / Treatments   Labs (all labs ordered are listed, but only abnormal results are displayed) Labs Reviewed   COMPREHENSIVE METABOLIC PANEL - Abnormal; Notable for the following components:      Result Value   Potassium 3.2 (*)    Chloride 90 (*)    Glucose, Bld 141 (*)    Total Protein 9.5 (*)    All other components within normal limits  CBC - Abnormal; Notable for the following components:   WBC 11.8 (*)    RBC 6.39 (*)    MCV 76.2 (*)    MCH 23.0 (*)    All other components within normal limits  URINALYSIS, ROUTINE W REFLEX MICROSCOPIC - Abnormal; Notable for the following components:   Hgb urine dipstick TRACE (*)    Protein, ur 100 (*)    All other components within normal limits  URINALYSIS, MICROSCOPIC (REFLEX) - Abnormal; Notable for the following components:   Bacteria, UA RARE (*)    All other components within normal limits  LIPASE, BLOOD  GC/CHLAMYDIA PROBE AMP ([place]) NOT AT [facility]    EKG None  Radiology No results found.  Procedures Procedures   Medications Ordered in ED Medications  ondansetron (ZOFRAN-ODT) disintegrating tablet 4 mg (4 mg Oral Given 02/13/21 1557)  sodium chloride 0.9 % bolus 1,000 mL (0 mLs Intravenous Stopped 02/13/21 1858)  potassium chloride SA (KLOR-CON) CR tablet 40 mEq (40 mEq Oral Given 02/13/21 1845)    ED Course  I have reviewed the triage vital signs and the nursing notes.  Pertinent labs & imaging results that were available during my care of the patient were reviewed by me and considered in my medical decision making (see chart for details).  Clinical Course as of 02/13/21 2009  Tue Feb 13, 2021  1735 WBC(!): 11.8 [CA]  1735 Potassium(!): 3.2 [CA]  1735 Glucose(!): 141 [CA]  1735 Protein(!): 100 [CA]    Clinical Course User Index [CA] Mannie Stabile, PA-C   MDM Rules/Calculators/A&P                         25 year old male presents to the ED due to nausea and vomiting x2 days after drinking on Saturday.  No fever or chills.  Patient notes numerous episodes of nonbloody, nonbilious emesis.  Upon arrival, patient  afebrile, not tachycardic or hypoxic.  Patient nontoxic-appearing.  Physical exam reassuring.  Abdomen soft, nondistended, nontender.  Doubt acute abdomen.  Patient given Zofran at triage.  Routine labs ordered at triage.  IV fluids given.  CBC significant for my leukocytosis at 11.8.  CMP significant for hypokalemia at 3.2 and hyperglycemia at 141.  No anion gap.  Doubt DKA.  Potassium repleted here in the ED.  UA significant for trace hematuria and proteinuria likely due to dehydration from numerous episodes of emesis.  No signs of infection.  Lipase normal at 38.  Doubt pancreatitis.  7:32 PM reassessed patient at bedside he notes improvement in symptoms after IV fluids and Zofran.  No episodes of emesis.  Patient able to tolerate p.o. at bedside without difficulty.  Patient discharged with nausea medication.  Instructed patient follow-up with PCP symptoms not improved within the next week. Strict ED precautions discussed with patient. Patient states understanding and agrees to plan. Patient discharged home in no acute distress and stable vitals  Final Clinical Impression(s) / ED Diagnoses Final diagnoses:  Non-intractable vomiting with nausea, unspecified vomiting type    Rx / DC Orders ED Discharge Orders    None       Jesusita Oka 02/13/21 2009    Tegeler, Canary Brim, MD 02/13/21 [phone]

## 2021-02-13 NOTE — Discharge Instructions (Addendum)
As discussed, all of your labs are reassuring today.  I suspect your nausea  and vomiting is related to alcohol consumption or marijuana use.  Continue to drink fluids.  I am sending you home with nausea medication.  Take as prescribed and as needed for nausea.  Please follow-up with PCP if symptoms not improved within the next week.  Return to the ER for new or worsening symptoms.

## 2021-02-13 NOTE — ED Triage Notes (Signed)
Pt c/o N/V since Sunday. States was drinking alcohol Saturday and "hasnt been able to recover". Able to tolerate gingerale. Denies abdominal pain. States also smokes marijuana.

## 2021-02-14 LAB — GC/CHLAMYDIA PROBE AMP (~~LOC~~) NOT AT ARMC
Chlamydia: NEGATIVE
Comment: NEGATIVE
Comment: NORMAL
Neisseria Gonorrhea: NEGATIVE

## 2021-09-03 ENCOUNTER — Other Ambulatory Visit: Payer: Self-pay

## 2021-09-03 ENCOUNTER — Emergency Department (HOSPITAL_BASED_OUTPATIENT_CLINIC_OR_DEPARTMENT_OTHER)
Admission: EM | Admit: 2021-09-03 | Discharge: 2021-09-03 | Disposition: A | Discharge status: Home / Self Care | Payer: Medicaid Other | Attending: Student | Admitting: Student

## 2021-09-03 ENCOUNTER — Encounter (HOSPITAL_BASED_OUTPATIENT_CLINIC_OR_DEPARTMENT_OTHER): Payer: Self-pay | Admitting: *Deleted

## 2021-09-03 DIAGNOSIS — Z0000 Encounter for general adult medical examination without abnormal findings: Secondary | ICD-10-CM | POA: Insufficient documentation

## 2021-09-03 DIAGNOSIS — Z560 Unemployment, unspecified: Secondary | ICD-10-CM

## 2021-09-03 NOTE — ED Provider Notes (Signed)
[facility] Provider Note   CSN: [id number] Arrival date & time: 09/03/21  1424     History Chief Complaint  Patient presents with   Letter for School/Work    Jason Hess is a 25 y.o. male. Patient states that over the past 3 to 4 days he had some nausea, vomiting, and diarrhea.  He is out of work during this time.  He denies any fevers, chills, congestion, sore throat, shortness of breath, chest pain.  He currently feels better and is able to tolerate fluid and food.  Denies any abdominal pain.  Feels he is ready to go back to work.  HPI     History reviewed. No pertinent past medical history.  There are no problems to display for this patient.   History reviewed. No pertinent surgical history.     No family history on file.  Social History   Tobacco Use   Smoking status: Never   Smokeless tobacco: Never  Substance Use Topics   Alcohol use: Yes   Drug use: Yes    Frequency: 3.0 times per week    Types: Marijuana    Home Medications Prior to Admission medications   Medication Sig Start Date End Date Taking? Authorizing Provider  Acetaminophen (CHLORASEPTIC SORE THROAT PO) Take 1-2 sprays by mouth daily as needed (sore throat).    [provider]  acetaminophen (TYLENOL) 325 MG tablet Take 650 mg by mouth every 6 (six) hours as needed for mild pain, fever or headache.    [provider]  ondansetron (ZOFRAN ODT) 4 MG disintegrating tablet Take 1 tablet (4 mg total) by mouth every 8 (eight) hours as needed for nausea or vomiting. 10/02/20   Charlann Lange, PA-C  promethazine (PHENERGAN) 50 MG tablet Take 0.5 tablets (25 mg total) by mouth every 6 (six) hours as needed for nausea or vomiting. 10/03/20   Kinnie Feil, PA-C    Allergies    Patient has no known allergies.  Review of Systems   Review of Systems  Constitutional:  Negative for chills and fever.  HENT:  Negative for ear pain and sore throat.    Eyes:  Negative for pain and visual disturbance.  Respiratory:  Negative for cough and shortness of breath.   Cardiovascular:  Negative for chest pain and palpitations.  Gastrointestinal:  Negative for abdominal pain and vomiting.  Genitourinary:  Negative for dysuria and hematuria.  Musculoskeletal:  Negative for arthralgias and back pain.  Skin:  Negative for color change and rash.  Neurological:  Negative for seizures and syncope.  All other systems reviewed and are negative.  Physical Exam Updated Vital Signs BP (!) 148/99 (BP Location: Right Arm)   Pulse 75   Temp 98.4 F (36.9 C) (Oral)   Resp 18   Ht 5\' 10"  (1.778 m)   Wt 108.9 kg   SpO2 98%   BMI 34.44 kg/m   Physical Exam Vitals and nursing note reviewed.  Constitutional:      General: He is not in acute distress.    Appearance: Normal appearance. He is well-developed. He is not ill-appearing, toxic-appearing or diaphoretic.  HENT:     Head: Normocephalic and atraumatic.     Nose: Nose normal. No nasal deformity, congestion or rhinorrhea.     Mouth/Throat:     Lips: Pink. No lesions.     Mouth: Mucous membranes are moist.     Pharynx: Oropharynx is clear. No oropharyngeal exudate or posterior oropharyngeal erythema.  Comments: No evidence of PTA, tonsillar exudate or swelling, pharyngeal erythema or swelling or exudate.  Uvula midline. Eyes:     General: Gaze aligned appropriately. No scleral icterus.       Right eye: No discharge.        Left eye: No discharge.     Conjunctiva/sclera: Conjunctivae normal.     Right eye: Right conjunctiva is not injected. No exudate or hemorrhage.    Left eye: Left conjunctiva is not injected. No exudate or hemorrhage. Pulmonary:     Effort: Pulmonary effort is normal. No respiratory distress.  Abdominal:     General: Abdomen is flat. There is no distension.     Palpations: Abdomen is soft. There is no mass.     Tenderness: There is no abdominal tenderness. There is no  guarding or rebound.     Hernia: No hernia is present.  Skin:    General: Skin is warm and dry.  Neurological:     Mental Status: He is alert and oriented to person, place, and time.  Psychiatric:        Mood and Affect: Mood normal.        Speech: Speech normal.        Behavior: Behavior normal. Behavior is cooperative.    ED Results / Procedures / Treatments   Labs (all labs ordered are listed, but only abnormal results are displayed) Labs Reviewed - No data to display  EKG None  Radiology No results found.  Procedures Procedures   Medications Ordered in ED Medications - No data to display  ED Course  I have reviewed the triage vital signs and the nursing notes.  Pertinent labs & imaging results that were available during my care of the patient were reviewed by me and considered in my medical decision making (see chart for details).    MDM Rules/Calculators/A&P                         Well-appearing 25 year old male presents after 3 days of nausea, vomiting, and diarrhea that is now resolved.  He has no lingering symptoms.  No suggestion of upper respiratory infection.  Abdomen soft and nontender.  He is tolerating p.o. intake he likely had a gastrointestinal virus.  Feel he is safe to return back to work. Return precautions provided  Final Clinical Impression(s) / ED Diagnoses Final diagnoses:  Out of work    Rx / DC Orders ED Discharge Orders     None        Therese Sarah 09/03/21 1544    Kommor, Wyn Forster, MD 09/04/21 0000

## 2021-09-03 NOTE — ED Triage Notes (Signed)
Pt reports needs work note to return to work after being out for n/v/d x 3 days, feels better.

## 2021-09-03 NOTE — ED Notes (Signed)
D/c paperwork reviewed with pt, including provided work note. Pt with no questions or concerns at time of d/c. Pt educated on high BP and encouraged to f/u with PCP. Pt verbalized understanding, ambulatory to ED exit.

## 2021-09-05 ENCOUNTER — Emergency Department (HOSPITAL_BASED_OUTPATIENT_CLINIC_OR_DEPARTMENT_OTHER)
Admission: EM | Admit: 2021-09-05 | Discharge: 2021-09-05 | Disposition: A | Discharge status: Home / Self Care | Payer: Medicaid Other | Attending: Emergency Medicine | Admitting: Emergency Medicine

## 2021-09-05 ENCOUNTER — Other Ambulatory Visit: Payer: Self-pay

## 2021-09-05 DIAGNOSIS — K2900 Acute gastritis without bleeding: Secondary | ICD-10-CM

## 2021-09-05 MED ORDER — ONDANSETRON 4 MG PO TBDP: 4.0000 mg | ORAL_TABLET | Freq: Three times a day (TID) | ORAL | 1 refills | Status: DC | PRN

## 2021-09-05 NOTE — ED Triage Notes (Addendum)
  Pt POV c/o n/v x5 days. Reports was feeling better Monday and then with emesis again yesterday. Denies fever, denies diarrhea. Tolerating liquids, unable to tolerate solids.   Pt denies nausea during triage, reports "actually Im kind of hungry"

## 2021-09-05 NOTE — ED Notes (Signed)
Pt provided saltines and water.

## 2021-09-05 NOTE — Discharge Instructions (Signed)
Take the Zofran as needed for nausea and vomiting.  If symptoms persist beyond 2 weeks will need additional work-up.  Return for any new or worse symptoms.  Work note provided.

## 2021-09-05 NOTE — ED Provider Notes (Signed)
[facility] Provider Note   CSN: [id number] Arrival date & time: 09/05/21  0813     History Chief Complaint  Patient presents with   Nausea    Jason Hess is a 25 y.o. male.  Patient with recurrence of nausea and 1 episode of vomiting yesterday.  Patient's had 5 days of nausea and vomiting had improved.  Patient was seen on the 21st and was starting to feel much better.  Patient able to hydrate with liquids well still having some difficulty with solid foods.  Patient is feeling hungry today.  Denies any abdominal pain.  No diarrhea.      No past medical history on file.  There are no problems to display for this patient.   No past surgical history on file.     No family history on file.  Social History   Tobacco Use   Smoking status: Never   Smokeless tobacco: Never  Substance Use Topics   Alcohol use: Yes   Drug use: Yes    Frequency: 3.0 times per week    Types: Marijuana    Home Medications Prior to Admission medications   Medication Sig Start Date End Date Taking? Authorizing Provider  ondansetron (ZOFRAN-ODT) 4 MG disintegrating tablet Take 1 tablet (4 mg total) by mouth every 8 (eight) hours as needed for nausea or vomiting. 09/05/21  Yes Vanetta Mulders, MD  Acetaminophen (CHLORASEPTIC SORE THROAT PO) Take 1-2 sprays by mouth daily as needed (sore throat).    [provider]  acetaminophen (TYLENOL) 325 MG tablet Take 650 mg by mouth every 6 (six) hours as needed for mild pain, fever or headache.    [provider]  ondansetron (ZOFRAN ODT) 4 MG disintegrating tablet Take 1 tablet (4 mg total) by mouth every 8 (eight) hours as needed for nausea or vomiting. 10/02/20   Elpidio Anis, PA-C  promethazine (PHENERGAN) 50 MG tablet Take 0.5 tablets (25 mg total) by mouth every 6 (six) hours as needed for nausea or vomiting. 10/03/20   Liberty Handy, PA-C    Allergies    Patient has no known  allergies.  Review of Systems   Review of Systems  Constitutional:  Negative for chills and fever.  HENT:  Negative for ear pain and sore throat.   Eyes:  Negative for pain and visual disturbance.  Respiratory:  Negative for cough and shortness of breath.   Cardiovascular:  Negative for chest pain and palpitations.  Gastrointestinal:  Positive for nausea and vomiting. Negative for abdominal pain and diarrhea.  Genitourinary:  Negative for dysuria and hematuria.  Musculoskeletal:  Negative for arthralgias and back pain.  Skin:  Negative for color change and rash.  Neurological:  Negative for seizures and syncope.  All other systems reviewed and are negative.  Physical Exam Updated Vital Signs BP (!) 131/93 (BP Location: Right Arm)   Pulse 77   Temp 98.2 F (36.8 C) (Oral)   Resp 17   Ht 1.778 m (5\' 10" )   Wt 108.9 kg   SpO2 99%   BMI 34.44 kg/m   Physical Exam Vitals and nursing note reviewed.  Constitutional:      General: He is not in acute distress.    Appearance: Normal appearance. He is well-developed. He is not toxic-appearing.  HENT:     Head: Normocephalic and atraumatic.     Mouth/Throat:     Mouth: Mucous membranes are moist.  Eyes:     General: No scleral  icterus.    Conjunctiva/sclera: Conjunctivae normal.  Cardiovascular:     Rate and Rhythm: Normal rate and regular rhythm.     Heart sounds: No murmur heard. Pulmonary:     Effort: Pulmonary effort is normal. No respiratory distress.     Breath sounds: Normal breath sounds.  Abdominal:     General: There is no distension.     Palpations: Abdomen is soft.     Tenderness: There is no abdominal tenderness. There is no guarding.  Musculoskeletal:        General: No swelling.     Cervical back: Neck supple.  Skin:    General: Skin is warm and dry.     Capillary Refill: Capillary refill takes less than 2 seconds.  Neurological:     Mental Status: He is alert.  Psychiatric:        Mood and Affect: Mood  normal.    ED Results / Procedures / Treatments   Labs (all labs ordered are listed, but only abnormal results are displayed) Labs Reviewed - No data to display  EKG None  Radiology No results found.  Procedures Procedures   Medications Ordered in ED Medications - No data to display  ED Course  I have reviewed the triage vital signs and the nursing notes.  Pertinent labs & imaging results that were available during my care of the patient were reviewed by me and considered in my medical decision making (see chart for details).    MDM Rules/Calculators/A&P                           Patient nontoxic no acute distress.  Abdomen soft and nontender.  This may just be persistent viral gastritis.  That he has not completely recovered from.  No signs of any significant dehydration.  We will go ahead and treat with Zofran OTD.  Patient will be provided work note.  If symptoms persist beyond 2 weeks patient will probably need further evaluation would like CT scan of the abdomen.  Patient will return for any new or worse symptoms.  Final Clinical Impression(s) / ED Diagnoses Final diagnoses:  Acute gastritis without hemorrhage, unspecified gastritis type    Rx / DC Orders ED Discharge Orders          Ordered    ondansetron (ZOFRAN-ODT) 4 MG disintegrating tablet  Every 8 hours PRN        09/05/21 [id number]             Fredia Sorrow, MD 09/05/21 [phone]

## 2021-09-05 NOTE — ED Notes (Signed)
D/c paperwork reviewed with pt, including prescription. Pt verbalized understanding, no questions or concerns at time of d/c. Pt ambulatory to ED exit, NAD.

## 2021-12-07 ENCOUNTER — Other Ambulatory Visit (HOSPITAL_BASED_OUTPATIENT_CLINIC_OR_DEPARTMENT_OTHER): Payer: Self-pay

## 2021-12-07 ENCOUNTER — Other Ambulatory Visit: Payer: Self-pay

## 2021-12-07 ENCOUNTER — Encounter (HOSPITAL_BASED_OUTPATIENT_CLINIC_OR_DEPARTMENT_OTHER): Payer: Self-pay

## 2021-12-07 ENCOUNTER — Emergency Department (HOSPITAL_BASED_OUTPATIENT_CLINIC_OR_DEPARTMENT_OTHER)
Admission: EM | Admit: 2021-12-07 | Discharge: 2021-12-07 | Disposition: A | Discharge status: Home / Self Care | Payer: BLUE CROSS/BLUE SHIELD | Attending: Emergency Medicine | Admitting: Emergency Medicine

## 2021-12-07 DIAGNOSIS — E876 Hypokalemia: Secondary | ICD-10-CM | POA: Insufficient documentation

## 2021-12-07 DIAGNOSIS — I10 Essential (primary) hypertension: Secondary | ICD-10-CM | POA: Insufficient documentation

## 2021-12-07 DIAGNOSIS — D72829 Elevated white blood cell count, unspecified: Secondary | ICD-10-CM | POA: Insufficient documentation

## 2021-12-07 DIAGNOSIS — A084 Viral intestinal infection, unspecified: Secondary | ICD-10-CM | POA: Insufficient documentation

## 2021-12-07 DIAGNOSIS — R112 Nausea with vomiting, unspecified: Secondary | ICD-10-CM | POA: Diagnosis present

## 2021-12-07 LAB — CBC WITH DIFFERENTIAL/PLATELET
Abs Immature Granulocytes: 0.03 10*3/uL (ref 0.00–0.07)
Basophils Absolute: 0 10*3/uL (ref 0.0–0.1)
Basophils Relative: 0 %
Eosinophils Absolute: 0 10*3/uL (ref 0.0–0.5)
Eosinophils Relative: 0 %
HCT: 45.2 % (ref 39.0–52.0)
Hemoglobin: 14.1 g/dL (ref 13.0–17.0)
Immature Granulocytes: 0 %
Lymphocytes Relative: 25 %
Lymphs Abs: 2.6 10*3/uL (ref 0.7–4.0)
MCH: 23.8 pg — ABNORMAL LOW (ref 26.0–34.0)
MCHC: 31.2 g/dL (ref 30.0–36.0)
MCV: 76.2 fL — ABNORMAL LOW (ref 80.0–100.0)
Monocytes Absolute: 0.8 10*3/uL (ref 0.1–1.0)
Monocytes Relative: 7 %
Neutro Abs: 7.2 10*3/uL (ref 1.7–7.7)
Neutrophils Relative %: 68 %
Platelets: 227 10*3/uL (ref 150–400)
RBC: 5.93 MIL/uL — ABNORMAL HIGH (ref 4.22–5.81)
RDW: 14.1 % (ref 11.5–15.5)
WBC: 10.7 10*3/uL — ABNORMAL HIGH (ref 4.0–10.5)
nRBC: 0 % (ref 0.0–0.2)

## 2021-12-07 LAB — COMPREHENSIVE METABOLIC PANEL
ALT: 19 U/L (ref 0–44)
AST: 22 U/L (ref 15–41)
Albumin: 4.8 g/dL (ref 3.5–5.0)
Alkaline Phosphatase: 114 U/L (ref 38–126)
Anion gap: 11 (ref 5–15)
BUN: 13 mg/dL (ref 6–20)
CO2: 33 mmol/L — ABNORMAL HIGH (ref 22–32)
Calcium: 9.8 mg/dL (ref 8.9–10.3)
Chloride: 91 mmol/L — ABNORMAL LOW (ref 98–111)
Creatinine, Ser: 1.07 mg/dL (ref 0.61–1.24)
GFR, Estimated: >60 mL/min (ref >60)
Glucose, Bld: 121 mg/dL — ABNORMAL HIGH (ref 70–99)
Potassium: 2.8 mmol/L — ABNORMAL LOW (ref 3.5–5.1)
Sodium: 135 mmol/L (ref 135–145)
Total Bilirubin: 0.9 mg/dL (ref 0.3–1.2)
Total Protein: 9.1 g/dL — ABNORMAL HIGH (ref 6.5–8.1)

## 2021-12-07 LAB — URINALYSIS, MICROSCOPIC (REFLEX)

## 2021-12-07 LAB — URINALYSIS, ROUTINE W REFLEX MICROSCOPIC
Glucose, UA: NEGATIVE mg/dL
Ketones, ur: NEGATIVE mg/dL
Leukocytes,Ua: NEGATIVE
Nitrite: NEGATIVE
Protein, ur: >300 mg/dL — AB
Specific Gravity, Urine: >=1.03 (ref 1.005–1.030)
pH: 5.5 (ref 5.0–8.0)

## 2021-12-07 LAB — LIPASE, BLOOD: Lipase: 26 U/L (ref 11–51)

## 2021-12-07 MED ORDER — SODIUM CHLORIDE 0.9 % IV BOLUS
1000.0000 mL | Freq: Once | INTRAVENOUS | Status: AC
  Administered 2021-12-07: 1000 mL via INTRAVENOUS

## 2021-12-07 MED ORDER — ONDANSETRON HCL 4 MG/2ML IJ SOLN
4.0000 mg | Freq: Once | INTRAMUSCULAR | Status: AC
  Administered 2021-12-07: 4 mg via INTRAVENOUS
  Filled 2021-12-07: qty 2

## 2021-12-07 MED ORDER — DICYCLOMINE HCL 20 MG PO TABS
20.0000 mg | ORAL_TABLET | Freq: Two times a day (BID) | ORAL | 0 refills | Status: DC
  Filled 2021-12-07: qty 20, 10d supply, fill #0

## 2021-12-07 MED ORDER — POTASSIUM CHLORIDE CRYS ER 20 MEQ PO TBCR
40.0000 meq | EXTENDED_RELEASE_TABLET | Freq: Once | ORAL | Status: AC
  Administered 2021-12-07: 40 meq via ORAL
  Filled 2021-12-07: qty 2

## 2021-12-07 MED ORDER — ONDANSETRON HCL 4 MG PO TABS
4.0000 mg | ORAL_TABLET | Freq: Four times a day (QID) | ORAL | 0 refills | Status: DC
  Filled 2021-12-07: qty 9, 3d supply, fill #0

## 2021-12-07 NOTE — ED Provider Notes (Signed)
[facility] Provider Note   CSN: [id number] Arrival date & time: 12/07/21  1005     History  Chief Complaint  Patient presents with   Nausea    Jason Hess is a 26 y.o. male. With no past medical history who presents to the emergency department with nausea and vomiting.  Patient states that Wednesday night he went to [facility] and had a salmon meal. He states he started having some queasiness on Wednesday night and woke up Thursday morning with nausea and vomiting. He states since then he has continued to have N/V with inability to tolerate solid foods. He states he has tolerated liquids. Denies diarrhea or fevers. He denies abdominal pain, just uneasiness. No urinary symptoms. Denies lightheadedness or dizziness. No previous abdominal surgeries.    HPI     Home Medications Prior to Admission medications   Medication Sig Start Date End Date Taking? Authorizing Provider  Acetaminophen (CHLORASEPTIC SORE THROAT PO) Take 1-2 sprays by mouth daily as needed (sore throat).    [provider]  acetaminophen (TYLENOL) 325 MG tablet Take 650 mg by mouth every 6 (six) hours as needed for mild pain, fever or headache.    [provider]  ondansetron (ZOFRAN ODT) 4 MG disintegrating tablet Take 1 tablet (4 mg total) by mouth every 8 (eight) hours as needed for nausea or vomiting. 10/02/20   Elpidio Anis, PA-C  ondansetron (ZOFRAN-ODT) 4 MG disintegrating tablet Take 1 tablet (4 mg total) by mouth every 8 (eight) hours as needed for nausea or vomiting. 09/05/21   Vanetta Mulders, MD  promethazine (PHENERGAN) 50 MG tablet Take 0.5 tablets (25 mg total) by mouth every 6 (six) hours as needed for nausea or vomiting. 10/03/20   Liberty Handy, PA-C      Allergies    Patient has no known allergies.    Review of Systems   Review of Systems  Constitutional:  Negative for fever.  Gastrointestinal:  Positive for nausea and vomiting.  Negative for abdominal pain and diarrhea.  Genitourinary:  Negative for dysuria, flank pain and penile discharge.  Neurological:  Negative for dizziness and light-headedness.  All other systems reviewed and are negative.  Physical Exam Updated Vital Signs BP (!) 176/106    Pulse (!) 55    Temp 98.4 F (36.9 C)    Resp 16    Wt 81.6 kg    SpO2 99%    BMI 25.83 kg/m  Physical Exam Vitals and nursing note reviewed.  Constitutional:      General: He is not in acute distress.    Appearance: Normal appearance. He is normal weight. He is ill-appearing. He is not toxic-appearing.  HENT:     Head: Normocephalic and atraumatic.     Nose: Nose normal.     Mouth/Throat:     Mouth: Mucous membranes are moist.     Pharynx: Oropharynx is clear.  Eyes:     General: No scleral icterus.    Extraocular Movements: Extraocular movements intact.     Conjunctiva/sclera: Conjunctivae normal.     Pupils: Pupils are equal, round, and reactive to light.  Cardiovascular:     Rate and Rhythm: Normal rate and regular rhythm.     Pulses: Normal pulses.     Heart sounds: Normal heart sounds. No murmur heard. Pulmonary:     Effort: Pulmonary effort is normal. No respiratory distress.     Breath sounds: Normal breath sounds.  Abdominal:  General: Bowel sounds are normal. There is no distension.     Palpations: Abdomen is soft.     Tenderness: There is no abdominal tenderness. There is no guarding.  Musculoskeletal:        General: Normal range of motion.     Cervical back: Neck supple.  Skin:    General: Skin is warm and dry.     Capillary Refill: Capillary refill takes less than 2 seconds.  Neurological:     General: No focal deficit present.     Mental Status: He is alert and oriented to person, place, and time. Mental status is at baseline.  Psychiatric:        Mood and Affect: Mood normal.        Behavior: Behavior normal.        Thought Content: Thought content normal.        Judgment:  Judgment normal.    ED Results / Procedures / Treatments   Labs (all labs ordered are listed, but only abnormal results are displayed) Labs Reviewed  COMPREHENSIVE METABOLIC PANEL - Abnormal; Notable for the following components:      Result Value   Potassium 2.8 (*)    Chloride 91 (*)    CO2 33 (*)    Glucose, Bld 121 (*)    Total Protein 9.1 (*)    All other components within normal limits  CBC WITH DIFFERENTIAL/PLATELET - Abnormal; Notable for the following components:   WBC 10.7 (*)    RBC 5.93 (*)    MCV 76.2 (*)    MCH 23.8 (*)    All other components within normal limits  URINALYSIS, ROUTINE W REFLEX MICROSCOPIC - Abnormal; Notable for the following components:   Hgb urine dipstick MODERATE (*)    Bilirubin Urine SMALL (*)    Protein, ur >300 (*)    All other components within normal limits  URINALYSIS, MICROSCOPIC (REFLEX) - Abnormal; Notable for the following components:   Bacteria, UA MANY (*)    All other components within normal limits  LIPASE, BLOOD   EKG None  Radiology No results found.  Procedures Procedures   Medications Ordered in ED Medications  sodium chloride 0.9 % bolus 1,000 mL (0 mLs Intravenous Stopped 12/07/21 1206)  ondansetron (ZOFRAN) injection 4 mg (4 mg Intravenous Given 12/07/21 1101)  potassium chloride SA (KLOR-CON M) CR tablet 40 mEq (40 mEq Oral Given 12/07/21 1204)   ED Course/ Medical Decision Making/ A&P                           Medical Decision Making Amount and/or Complexity of Data Reviewed Labs: ordered.  Risk Prescription drug management.  Patient presents to the ED with complaints of nausea and vomiting. This involves an extensive number of treatment options, and is a complaint that carries with it a moderate risk of complications and morbidity.   Additional history obtained:  Additional history obtained from: none  External records from outside source obtained and reviewed including: previous primary care visits    Lab Results: I personally ordered, reviewed, and interpreted labs. Pertinent results include: CMP with potassium of 2.8 -> given oral replacement, evidence of dehydration CBC with leukocytosis to 10.7, likely reactive UA without evidence of UTI, increased protein with hypertension here in the emergency department, discussed PCP follow-up Lipase 26, negative  Medications  I ordered medication including IVF and Zofran for nausea, dehydration, potassium for replacement Reevaluation of the patient after  medication shows that patient improved  ED Course: 26 year old man who presents to the emergency department with nausea and vomiting.  Likely secondary to benign infectious cause such as gastroenteritis likely from his salmon dinner on Wednesday night.  Considered but low risk SBO, he is still passing flatus and no history of abdominal surgeries.  No history of DKA and no signs of DKA on labs.  BMP with hypokalemia, no evidence of AKI from dehydration.  I have low suspicion for gastroesophageal dysmotility.  Patient with no chest pain, doubt this is ACS.  Based on history, exam and work-up low suspicion for pancreatitis (lipase negative), appendicitis, biliary pathology.  Doubt invasive bacterial cause such as C. difficile as he has no recent antibiotics, Shiga toxin as he has nonbloody bowel movements and no diarrhea.  No recent travel, not immunocompromised.  Doubt other emergent problem.  Was given Zofran and fluids here in the emergency department with improvement in symptoms.  Also given potassium replacement.  Believe that he would be able to tolerate Gatorade and other electrolyte drinks outpatient and can orally replete in the coming week.  He was also given IV fluids here in the emergency department.  Given p.o. trial here and was able to tolerate.  He is being discharged with Zofran and Bentyl with primary care follow-up.  He is instructed return emergency department should he have worsening  symptoms along with inability to tolerate liquids, fever, lightheadedness or dizziness with the symptoms.  He verbalized understanding.  After consideration of the diagnostic results and the patients response to treatment, I feel that the patent would benefit from discharge. The patient has been appropriately medically screened and/or stabilized in the ED. I have low suspicion for any other emergent medical condition which would require further screening, evaluation or treatment in the ED or require inpatient management. The patient is overall well appearing and non-toxic in appearance. They are hemodynamically stable at time of discharge.   Final Clinical Impression(s) / ED Diagnoses Final diagnoses:  Viral gastroenteritis    Rx / DC Orders ED Discharge Orders          Ordered    ondansetron (ZOFRAN) 4 MG tablet  Every 6 hours        12/07/21 1231    dicyclomine (BENTYL) 20 MG tablet  2 times daily        12/07/21 1231              Cristopher Peru, PA-C 12/07/21 1237    Jacalyn Lefevre, MD 12/07/21 1547

## 2021-12-07 NOTE — ED Triage Notes (Signed)
Pt reports nausea and vomitng since yesterday morning . Thinks possible food poisoning. Abdominal pain only prior to vomiting. No diarrhea

## 2021-12-07 NOTE — Discharge Instructions (Addendum)
You are seen in the emergency department today for nausea and vomiting.  This is likely viral illness which will run its course.  I have prescribed you Zofran that you can use every 6 hours for nausea or vomiting.  If you begin to have diarrhea have also prescribed you a medication called Bentyl that you can use twice daily which may help with some abdominal cramping and diarrheal symptoms.  Please eat a bland diet in the next few days.  Please return to the emergency department with worsening abdominal pain with fever or you are unable to tolerate liquids.

## 2022-03-13 ENCOUNTER — Emergency Department (HOSPITAL_BASED_OUTPATIENT_CLINIC_OR_DEPARTMENT_OTHER)
Admission: EM | Admit: 2022-03-13 | Discharge: 2022-03-13 | Disposition: A | Discharge status: Home / Self Care | Payer: BLUE CROSS/BLUE SHIELD | Attending: Emergency Medicine | Admitting: Emergency Medicine

## 2022-03-13 ENCOUNTER — Other Ambulatory Visit (HOSPITAL_BASED_OUTPATIENT_CLINIC_OR_DEPARTMENT_OTHER): Payer: Self-pay

## 2022-03-13 ENCOUNTER — Encounter (HOSPITAL_BASED_OUTPATIENT_CLINIC_OR_DEPARTMENT_OTHER): Payer: Self-pay

## 2022-03-13 ENCOUNTER — Other Ambulatory Visit: Payer: Self-pay

## 2022-03-13 DIAGNOSIS — R110 Nausea: Secondary | ICD-10-CM

## 2022-03-13 DIAGNOSIS — R112 Nausea with vomiting, unspecified: Secondary | ICD-10-CM | POA: Insufficient documentation

## 2022-03-13 DIAGNOSIS — D72829 Elevated white blood cell count, unspecified: Secondary | ICD-10-CM | POA: Diagnosis not present

## 2022-03-13 LAB — CBC
HCT: 46.3 % (ref 39.0–52.0)
Hemoglobin: 14.5 g/dL (ref 13.0–17.0)
MCH: 23.5 pg — ABNORMAL LOW (ref 26.0–34.0)
MCHC: 31.3 g/dL (ref 30.0–36.0)
MCV: 75 fL — ABNORMAL LOW (ref 80.0–100.0)
Platelets: 238 10*3/uL (ref 150–400)
RBC: 6.17 MIL/uL — ABNORMAL HIGH (ref 4.22–5.81)
RDW: 13.8 % (ref 11.5–15.5)
WBC: 11.7 10*3/uL — ABNORMAL HIGH (ref 4.0–10.5)
nRBC: 0 % (ref 0.0–0.2)

## 2022-03-13 LAB — BASIC METABOLIC PANEL
Anion gap: 12 (ref 5–15)
BUN: 10 mg/dL (ref 6–20)
CO2: 30 mmol/L (ref 22–32)
Calcium: 9.7 mg/dL (ref 8.9–10.3)
Chloride: 93 mmol/L — ABNORMAL LOW (ref 98–111)
Creatinine, Ser: 1.07 mg/dL (ref 0.61–1.24)
GFR, Estimated: >60 mL/min (ref >60)
Glucose, Bld: 116 mg/dL — ABNORMAL HIGH (ref 70–99)
Potassium: 3.5 mmol/L (ref 3.5–5.1)
Sodium: 135 mmol/L (ref 135–145)

## 2022-03-13 MED ORDER — ONDANSETRON HCL 4 MG PO TABS
4.0000 mg | ORAL_TABLET | Freq: Four times a day (QID) | ORAL | 0 refills | Status: DC
  Filled 2022-03-13: qty 9, 3d supply, fill #0

## 2022-03-13 MED ORDER — ONDANSETRON HCL 4 MG/2ML IJ SOLN
4.0000 mg | Freq: Once | INTRAMUSCULAR | Status: AC
  Administered 2022-03-13: 4 mg via INTRAVENOUS
  Filled 2022-03-13: qty 2

## 2022-03-13 MED ORDER — SODIUM CHLORIDE 0.9 % IV BOLUS
1000.0000 mL | Freq: Once | INTRAVENOUS | Status: AC
  Administered 2022-03-13: 1000 mL via INTRAVENOUS

## 2022-03-13 NOTE — ED Triage Notes (Signed)
Pt reports nausea started Monday. Vomiting 3 x yesterday. No diarrhea. Mild abdominal pain

## 2022-03-13 NOTE — ED Provider Notes (Signed)
[facility] EMERGENCY DEPARTMENT Provider Note   CSN: [id number] Arrival date & time: 03/13/22  0911     History  Chief Complaint  Patient presents with   Nausea    Jason Hess is a 26 y.o. male who presents the emergency department complaining of nausea for 2 days.  Patient reports 3 episodes of nonbloody nonbilious emesis yesterday.  He complains of mild abdominal pain just before vomiting, none at baseline.  Denies fever, urinary symptoms, diarrhea.  HPI     Home Medications Prior to Admission medications   Medication Sig Start Date End Date Taking? Authorizing Provider  ondansetron (ZOFRAN) 4 MG tablet Take 1 tablet (4 mg total) by mouth every 6 (six) hours. 03/13/22  Yes Preslei Blakley T, PA-C  Acetaminophen (CHLORASEPTIC SORE THROAT PO) Take 1-2 sprays by mouth daily as needed (sore throat).    [provider]  acetaminophen (TYLENOL) 325 MG tablet Take 650 mg by mouth every 6 (six) hours as needed for mild pain, fever or headache.    [provider]  dicyclomine (BENTYL) 20 MG tablet Take 1 tablet (20 mg total) by mouth 2 (two) times daily. 12/07/21   Cristopher Peru, PA-C  promethazine (PHENERGAN) 50 MG tablet Take 0.5 tablets (25 mg total) by mouth every 6 (six) hours as needed for nausea or vomiting. 10/03/20   Liberty Handy, PA-C      Allergies    Patient has no known allergies.    Review of Systems   Review of Systems  Constitutional:  Negative for chills and fever.  Gastrointestinal:  Positive for abdominal pain, nausea and vomiting. Negative for blood in stool, constipation and diarrhea.  Genitourinary:  Negative for dysuria, frequency, hematuria and urgency.  All other systems reviewed and are negative.  Physical Exam Updated Vital Signs BP 121/74   Pulse (!) 54   Temp 98.3 F (36.8 C)   Resp 18   Ht 5\' 11"  (1.803 m)   SpO2 97%   BMI 25.10 kg/m  Physical Exam Vitals and nursing note reviewed.  Constitutional:       Appearance: Normal appearance.  HENT:     Head: Normocephalic and atraumatic.  Eyes:     Conjunctiva/sclera: Conjunctivae normal.  Cardiovascular:     Rate and Rhythm: Normal rate and regular rhythm.  Pulmonary:     Effort: Pulmonary effort is normal. No respiratory distress.     Breath sounds: Normal breath sounds.  Abdominal:     General: There is no distension.     Palpations: Abdomen is soft.     Tenderness: There is no abdominal tenderness.  Skin:    General: Skin is warm and dry.  Neurological:     General: No focal deficit present.     Mental Status: He is alert.    ED Results / Procedures / Treatments   Labs (all labs ordered are listed, but only abnormal results are displayed) Labs Reviewed  CBC - Abnormal; Notable for the following components:      Result Value   WBC 11.7 (*)    RBC 6.17 (*)    MCV 75.0 (*)    MCH 23.5 (*)    All other components within normal limits  BASIC METABOLIC PANEL - Abnormal; Notable for the following components:   Chloride 93 (*)    Glucose, Bld 116 (*)    All other components within normal limits    EKG None  Radiology No results found.  Procedures Procedures  Medications Ordered in ED Medications  sodium chloride 0.9 % bolus 1,000 mL (0 mLs Intravenous Stopped 03/13/22 1056)  ondansetron (ZOFRAN) injection 4 mg (4 mg Intravenous Given 03/13/22 0954)    ED Course/ Medical Decision Making/ A&P                           Medical Decision Making Amount and/or Complexity of Data Reviewed Labs: ordered.  Risk Prescription drug management.   This patient is a 26 y.o. male  who presents to the ED for concern of nausea and vomiting.   Differential diagnoses prior to evaluation: The emergent differential diagnosis includes, but is not limited to,  ACS/MI, DKA, elevated ICP, Ischemic bowel, Sepsis, Drug-related, Appendicitis, Bowel obstruction, Electrolyte abnormalities, Pancreatitis, Biliary colic, Gastroenteritis,  Gastroparesis, Hepatitis, Migraine, Renal colic, GERD/PUD, UTI. This is not an exhaustive differential.   Past Medical History / Co-morbidities: History reviewed. No pertinent past medical history.  Additional history: Chart reviewed. Pertinent results include: Pt seen in ER numerous times over past two years for non-intractable vomiting.   Physical Exam: Physical exam performed. The pertinent findings include: Normal vital signs. Abdomen soft, non-tender. Does not appear clinically dehydrated.   Lab Tests/Imaging studies: I Ordered, and personally interpreted labs/imaging and the pertinent results include:  mild leukocytosis of 11,700. Electrolytes grossly WNL.    Medications: I ordered medication including IV fluids and zofran  for nausea and vomiting.  I have reviewed the patients home medicines and have made adjustments as needed.   Disposition: After consideration of the diagnostic results and the patients response to treatment, I feel that patient is not requiring admission. Suspect symptoms related to chronic nausea/vomiting or gastroenteritis. Repeat abdominal exam remains non-surgical. Will treat symptomatically with fluids and anti-emetics. Discussed reasons to return to the emergency department, and the patient is agreeable to the plan.   Final Clinical Impression(s) / ED Diagnoses Final diagnoses:  Nausea    Rx / DC Orders ED Discharge Orders          Ordered    ondansetron (ZOFRAN) 4 MG tablet  Every 6 hours        03/13/22 1119           Portions of this report may have been transcribed using voice recognition software. Every effort was made to ensure accuracy; however, inadvertent computerized transcription errors may be present.    Jeanella Flattery 03/13/22 1123    Jacalyn Lefevre, MD 03/13/22 1145

## 2022-03-13 NOTE — Discharge Instructions (Addendum)
He was in the emergency department today for nausea.  As we discussed your lab work all looked reassuring today.  We have given you some IV fluids and some nausea medicine, numbness in your prescription for the same nausea medicine for home.  You can take this every 6 hours as needed.  Make sure that you are staying well-hydrated.  Continue to monitor how you are doing and return to the emergency department for any new or worsening symptoms such as fever, worsening abdominal pain, persistent vomiting or diarrhea.

## 2022-06-11 ENCOUNTER — Encounter (HOSPITAL_BASED_OUTPATIENT_CLINIC_OR_DEPARTMENT_OTHER): Payer: Self-pay | Admitting: Emergency Medicine

## 2022-06-11 ENCOUNTER — Other Ambulatory Visit: Payer: Self-pay

## 2022-06-11 ENCOUNTER — Emergency Department (HOSPITAL_BASED_OUTPATIENT_CLINIC_OR_DEPARTMENT_OTHER)
Admission: EM | Admit: 2022-06-11 | Discharge: 2022-06-11 | Disposition: A | Discharge status: Home / Self Care | Payer: BLUE CROSS/BLUE SHIELD | Attending: Emergency Medicine | Admitting: Emergency Medicine

## 2022-06-11 DIAGNOSIS — Z20822 Contact with and (suspected) exposure to covid-19: Secondary | ICD-10-CM | POA: Diagnosis not present

## 2022-06-11 DIAGNOSIS — E876 Hypokalemia: Secondary | ICD-10-CM | POA: Diagnosis not present

## 2022-06-11 DIAGNOSIS — R112 Nausea with vomiting, unspecified: Secondary | ICD-10-CM | POA: Diagnosis present

## 2022-06-11 DIAGNOSIS — E860 Dehydration: Secondary | ICD-10-CM | POA: Diagnosis not present

## 2022-06-11 LAB — URINALYSIS, ROUTINE W REFLEX MICROSCOPIC
Glucose, UA: NEGATIVE mg/dL
Ketones, ur: 40 mg/dL — AB
Leukocytes,Ua: NEGATIVE
Nitrite: NEGATIVE
Protein, ur: 100 mg/dL — AB
Specific Gravity, Urine: 1.02 (ref 1.005–1.030)
pH: 7 (ref 5.0–8.0)

## 2022-06-11 LAB — URINALYSIS, MICROSCOPIC (REFLEX)

## 2022-06-11 LAB — LIPASE, BLOOD: Lipase: 27 U/L (ref 11–51)

## 2022-06-11 LAB — CBC
HCT: 45.1 % (ref 39.0–52.0)
Hemoglobin: 14.1 g/dL (ref 13.0–17.0)
MCH: 23.4 pg — ABNORMAL LOW (ref 26.0–34.0)
MCHC: 31.3 g/dL (ref 30.0–36.0)
MCV: 74.9 fL — ABNORMAL LOW (ref 80.0–100.0)
Platelets: 183 10*3/uL (ref 150–400)
RBC: 6.02 MIL/uL — ABNORMAL HIGH (ref 4.22–5.81)
RDW: 14.1 % (ref 11.5–15.5)
WBC: 11.4 10*3/uL — ABNORMAL HIGH (ref 4.0–10.5)
nRBC: 0 % (ref 0.0–0.2)

## 2022-06-11 LAB — RESP PANEL BY RT-PCR (FLU A&B, COVID) ARPGX2
Influenza A by PCR: NEGATIVE
Influenza B by PCR: NEGATIVE
SARS Coronavirus 2 by RT PCR: NEGATIVE

## 2022-06-11 LAB — COMPREHENSIVE METABOLIC PANEL
ALT: 16 U/L (ref 0–44)
AST: 17 U/L (ref 15–41)
Albumin: 4.7 g/dL (ref 3.5–5.0)
Alkaline Phosphatase: 119 U/L (ref 38–126)
Anion gap: 12 (ref 5–15)
BUN: 11 mg/dL (ref 6–20)
CO2: 31 mmol/L (ref 22–32)
Calcium: 9.5 mg/dL (ref 8.9–10.3)
Chloride: 91 mmol/L — ABNORMAL LOW (ref 98–111)
Creatinine, Ser: 1.02 mg/dL (ref 0.61–1.24)
GFR, Estimated: >60 mL/min (ref >60)
Glucose, Bld: 111 mg/dL — ABNORMAL HIGH (ref 70–99)
Potassium: 2.9 mmol/L — ABNORMAL LOW (ref 3.5–5.1)
Sodium: 134 mmol/L — ABNORMAL LOW (ref 135–145)
Total Bilirubin: 0.9 mg/dL (ref 0.3–1.2)
Total Protein: 8.8 g/dL — ABNORMAL HIGH (ref 6.5–8.1)

## 2022-06-11 MED ORDER — METOCLOPRAMIDE HCL 5 MG/ML IJ SOLN
5.0000 mg | Freq: Once | INTRAMUSCULAR | Status: AC
  Administered 2022-06-11: 5 mg via INTRAVENOUS
  Filled 2022-06-11: qty 2

## 2022-06-11 MED ORDER — FAMOTIDINE IN NACL 20-0.9 MG/50ML-% IV SOLN
20.0000 mg | Freq: Once | INTRAVENOUS | Status: AC
  Administered 2022-06-11: 20 mg via INTRAVENOUS
  Filled 2022-06-11: qty 50

## 2022-06-11 MED ORDER — SODIUM CHLORIDE 0.9 % IV BOLUS
1000.0000 mL | Freq: Once | INTRAVENOUS | Status: AC
  Administered 2022-06-11: 1000 mL via INTRAVENOUS

## 2022-06-11 MED ORDER — ONDANSETRON HCL 4 MG/2ML IJ SOLN
4.0000 mg | Freq: Once | INTRAMUSCULAR | Status: AC
  Administered 2022-06-11: 4 mg via INTRAVENOUS
  Filled 2022-06-11: qty 2

## 2022-06-11 MED ORDER — POTASSIUM CHLORIDE ER 10 MEQ PO TBCR: 10.0000 meq | EXTENDED_RELEASE_TABLET | Freq: Every day | ORAL | 0 refills | Status: DC

## 2022-06-11 MED ORDER — SUCRALFATE 1 G PO TABS: 1.0000 g | ORAL_TABLET | Freq: Three times a day (TID) | ORAL | 0 refills | Status: DC

## 2022-06-11 MED ORDER — MAGNESIUM OXIDE -MG SUPPLEMENT 200 MG PO TABS: 1.0000 | ORAL_TABLET | Freq: Every day | ORAL | 0 refills | Status: AC

## 2022-06-11 MED ORDER — SODIUM CHLORIDE 0.9 % IV SOLN: INTRAVENOUS | Status: DC

## 2022-06-11 MED ORDER — DIPHENHYDRAMINE HCL 50 MG/ML IJ SOLN
25.0000 mg | Freq: Once | INTRAMUSCULAR | Status: AC
  Administered 2022-06-11: 25 mg via INTRAVENOUS
  Filled 2022-06-11: qty 1

## 2022-06-11 MED ORDER — ONDANSETRON HCL 4 MG PO TABS
4.0000 mg | ORAL_TABLET | ORAL | 0 refills | Status: DC | PRN
Start: 2022-06-11 — End: 2022-09-27

## 2022-06-11 MED ORDER — POTASSIUM CHLORIDE CRYS ER 20 MEQ PO TBCR
40.0000 meq | EXTENDED_RELEASE_TABLET | Freq: Once | ORAL | Status: AC
  Administered 2022-06-11: 40 meq via ORAL
  Filled 2022-06-11: qty 2

## 2022-06-11 MED ORDER — POTASSIUM CHLORIDE 10 MEQ/100ML IV SOLN
10.0000 meq | INTRAVENOUS | Status: AC
  Administered 2022-06-11: 10 meq via INTRAVENOUS
  Filled 2022-06-11: qty 100

## 2022-06-11 NOTE — ED Notes (Signed)
ED Provider at bedside. 

## 2022-06-11 NOTE — ED Triage Notes (Signed)
Emesis x 3 days , denies diarrhea . Abdominal pain only when vomiting

## 2022-06-11 NOTE — Discharge Instructions (Addendum)
It was a pleasure caring for you today in the emergency department.  Please return to the emergency department for any worsening or worrisome symptoms.  Please see your pcp in the next 2-3 days for repeat potassium level

## 2022-06-11 NOTE — ED Provider Notes (Signed)
[facility] EMERGENCY DEPARTMENT Provider Note   CSN: [id number] Arrival date & time: 06/11/22  1405     History  Chief Complaint  Patient presents with   Emesis    Jason Hess is a 26 y.o. male.  Patient as above with significant medical history as below, including recurrent nausea/ vomiting  who presents to the ED with complaint of n/v. Symptom onset Sunday, he was drinking etoh Saturday. Sunday began to have intermittent abd cramping then nausea and vomiting. No billious emesis or blood to his emesis. No sig abdominal pain ongoing. No change to bowel/bladder fxn. No solid food since Sunday, has been tolerating liquids somewhat but will have intermittent vomiting after po intake. No fevers or chills. No abd surgeries, no thc use, no daily etoh use. No daily medications.      History reviewed. No pertinent past medical history.  History reviewed. No pertinent surgical history.   The history is provided by the patient. No language interpreter was used.  Emesis Associated symptoms: abdominal pain   Associated symptoms: no chills, no cough, no fever and no headaches        Home Medications Prior to Admission medications   Medication Sig Start Date End Date Taking? Authorizing Provider  ondansetron (ZOFRAN) 4 MG tablet Take 1 tablet (4 mg total) by mouth every 4 (four) hours as needed for nausea or vomiting. 06/11/22  Yes Tanda Rockers A, DO  sucralfate (CARAFATE) 1 g tablet Take 1 tablet (1 g total) by mouth 4 (four) times daily -  with meals and at bedtime for 7 days. 06/11/22 06/18/22 Yes Sloan Leiter, DO  Acetaminophen (CHLORASEPTIC SORE THROAT PO) Take 1-2 sprays by mouth daily as needed (sore throat).    [provider]  acetaminophen (TYLENOL) 325 MG tablet Take 650 mg by mouth every 6 (six) hours as needed for mild pain, fever or headache.    [provider]  dicyclomine (BENTYL) 20 MG tablet Take 1 tablet (20 mg total) by mouth 2 (two) times daily.  12/07/21   Cristopher Peru, PA-C  ondansetron (ZOFRAN) 4 MG tablet Take 1 tablet (4 mg total) by mouth every 6 (six) hours. 03/13/22   Roemhildt, Lorin T, PA-C  promethazine (PHENERGAN) 50 MG tablet Take 0.5 tablets (25 mg total) by mouth every 6 (six) hours as needed for nausea or vomiting. 10/03/20   Liberty Handy, PA-C      Allergies    Patient has no known allergies.    Review of Systems   Review of Systems  Constitutional:  Negative for chills and fever.  HENT:  Negative for facial swelling and trouble swallowing.   Eyes:  Negative for photophobia and visual disturbance.  Respiratory:  Negative for cough and shortness of breath.   Cardiovascular:  Negative for chest pain and palpitations.  Gastrointestinal:  Positive for abdominal pain, nausea and vomiting.  Endocrine: Negative for polydipsia and polyuria.  Genitourinary:  Negative for difficulty urinating and hematuria.  Musculoskeletal:  Negative for gait problem and joint swelling.  Skin:  Negative for pallor and rash.  Neurological:  Negative for syncope and headaches.  Psychiatric/Behavioral:  Negative for agitation and confusion.     Physical Exam Updated Vital Signs BP 122/79   Pulse (!) 56   Temp 99.5 F (37.5 C)   Resp 18   Ht 5\' 10"  (1.778 m)   Wt 96 kg   SpO2 99%   BMI 30.37 kg/m  Physical Exam Vitals and  nursing note reviewed.  Constitutional:      General: He is not in acute distress.    Appearance: Normal appearance. He is well-developed. He is not ill-appearing or diaphoretic.  HENT:     Head: Normocephalic and atraumatic.     Right Ear: External ear normal.     Left Ear: External ear normal.     Mouth/Throat:     Mouth: Mucous membranes are moist.  Eyes:     General: No scleral icterus. Cardiovascular:     Rate and Rhythm: Normal rate and regular rhythm.     Pulses: Normal pulses.     Heart sounds: Normal heart sounds.  Pulmonary:     Effort: Pulmonary effort is normal. No respiratory  distress.     Breath sounds: Normal breath sounds.  Abdominal:     General: Abdomen is flat. There is no distension.     Palpations: Abdomen is soft.     Tenderness: There is no abdominal tenderness. There is no guarding or rebound.  Musculoskeletal:        General: Normal range of motion.     Cervical back: Normal range of motion.     Right lower leg: No edema.     Left lower leg: No edema.  Skin:    General: Skin is warm and dry.     Capillary Refill: Capillary refill takes less than 2 seconds.  Neurological:     Mental Status: He is alert and oriented to person, place, and time.  Psychiatric:        Mood and Affect: Mood normal.        Behavior: Behavior normal.     ED Results / Procedures / Treatments   Labs (all labs ordered are listed, but only abnormal results are displayed) Labs Reviewed  COMPREHENSIVE METABOLIC PANEL - Abnormal; Notable for the following components:      Result Value   Sodium 134 (*)    Potassium 2.9 (*)    Chloride 91 (*)    Glucose, Bld 111 (*)    Total Protein 8.8 (*)    All other components within normal limits  CBC - Abnormal; Notable for the following components:   WBC 11.4 (*)    RBC 6.02 (*)    MCV 74.9 (*)    MCH 23.4 (*)    All other components within normal limits  URINALYSIS, ROUTINE W REFLEX MICROSCOPIC - Abnormal; Notable for the following components:   Hgb urine dipstick TRACE (*)    Bilirubin Urine SMALL (*)    Ketones, ur 40 (*)    Protein, ur 100 (*)    All other components within normal limits  URINALYSIS, MICROSCOPIC (REFLEX) - Abnormal; Notable for the following components:   Bacteria, UA FEW (*)    All other components within normal limits  RESP PANEL BY RT-PCR (FLU A&B, COVID) ARPGX2  LIPASE, BLOOD    EKG None  Radiology No results found.  Procedures Procedures    Medications Ordered in ED Medications  potassium chloride 10 mEq in 100 mL IVPB (0 mEq Intravenous Stopped 06/11/22 1944)  0.9 %  sodium  chloride infusion ( Intravenous New Bag/Given 06/11/22 1756)  sodium chloride 0.9 % bolus 1,000 mL (0 mLs Intravenous Stopped 06/11/22 1734)  ondansetron (ZOFRAN) injection 4 mg (4 mg Intravenous Given 06/11/22 1620)  famotidine (PEPCID) IVPB 20 mg premix (0 mg Intravenous Stopped 06/11/22 1734)  potassium chloride SA (KLOR-CON M) CR tablet 40 mEq (40 mEq Oral Given 06/11/22  1759)  metoCLOPramide (REGLAN) injection 5 mg (5 mg Intravenous Given 06/11/22 1825)  diphenhydrAMINE (BENADRYL) injection 25 mg (25 mg Intravenous Given 06/11/22 1823)    ED Course/ Medical Decision Making/ A&P                           Medical Decision Making Amount and/or Complexity of Data Reviewed Labs: ordered.  Risk Prescription drug management.   This patient presents to the ED with chief complaint(s) of n/v with pertinent past medical history of recurrent n/v which further complicates the presenting complaint. The complaint involves an extensive differential diagnosis and also carries with it a high risk of complications and morbidity.    Differential diagnosis includes but is not exclusive to acute cholecystitis, intrathoracic causes for epigastric abdominal pain, gastritis, duodenitis, pancreatitis, small bowel or large bowel obstruction, abdominal aortic aneurysm, hernia, gastritis, etc.  . Serious etiologies were considered.   The initial plan is to screening labs, ivf, zofran   Additional history obtained: Additional history obtained from  n/a Records reviewed  prior ed visits, prior labs/imaging   Independent labs interpretation:  The following labs were independently interpreted:  Potassium is depleted 2.9.  Creatinine stable.  Replaced orally and intravenously. CBC with mild leukocytosis 11.4, favor demargination in setting of nausea and vomiting.  Independent visualization of imaging: Cardiac monitoring was reviewed and interpreted by myself which shows n/a  Treatment and  Reassessment: IVF Anti emetics Potassium replacement >>> Significant improvement in the patient's symptoms, he is able tolerate p.o. intake without difficulty.  Abdominal exam is soft, nontender, nonperitoneal   Consultation: - Consulted or discussed management/test interpretation w/ external professional: Not applicable  Consideration for admission or further workup: Admission was considered   Patient presents with vomiting, nausea, and abdominal cramping. Sx suggestive of enteritis or food born illness. Pt also uses THC frequently, cannabinoid hyperemesis is also a consideration.  Surgical or other more serious etiology appears very unlikely. The patient is improved with ED treatment. Will discharge with observation and symptomatic treatment. Abdominal pain warnings discussed.  Recommend THC avoidance  F/u with pcp in next 2-3 days for rpt K+ level  The patient improved significantly and was discharged in stable condition. Detailed discussions were had with the patient regarding current findings, and need for close f/u with PCP or on call doctor. The patient has been instructed to return immediately if the symptoms worsen in any way for re-evaluation. Patient verbalized understanding and is in agreement with current care plan. All questions answered prior to discharge.    Social Determinants of health: Social History   Tobacco Use   Smoking status: Never   Smokeless tobacco: Never  Substance Use Topics   Alcohol use: Yes   Drug use: Not Currently    Frequency: 3.0 times per week    Types: Marijuana            Final Clinical Impression(s) / ED Diagnoses Final diagnoses:  Nausea and vomiting, unspecified vomiting type  Mild dehydration  Hypokalemia    Rx / DC Orders ED Discharge Orders          Ordered    ondansetron (ZOFRAN) 4 MG tablet  Every 4 hours PRN        06/11/22 2035    sucralfate (CARAFATE) 1 g tablet  3 times daily with meals & bedtime         06/11/22 2035  Sloan Leiter, DO 06/11/22 2043

## 2022-09-27 ENCOUNTER — Other Ambulatory Visit: Payer: Self-pay

## 2022-09-27 ENCOUNTER — Other Ambulatory Visit (HOSPITAL_BASED_OUTPATIENT_CLINIC_OR_DEPARTMENT_OTHER): Payer: Self-pay

## 2022-09-27 ENCOUNTER — Encounter (HOSPITAL_BASED_OUTPATIENT_CLINIC_OR_DEPARTMENT_OTHER): Payer: Self-pay | Admitting: Emergency Medicine

## 2022-09-27 ENCOUNTER — Emergency Department (HOSPITAL_BASED_OUTPATIENT_CLINIC_OR_DEPARTMENT_OTHER)
Admission: EM | Admit: 2022-09-27 | Discharge: 2022-09-27 | Disposition: A | Discharge status: Home / Self Care | Payer: 59 | Attending: Emergency Medicine | Admitting: Emergency Medicine

## 2022-09-27 DIAGNOSIS — R1111 Vomiting without nausea: Secondary | ICD-10-CM

## 2022-09-27 DIAGNOSIS — R112 Nausea with vomiting, unspecified: Secondary | ICD-10-CM | POA: Diagnosis present

## 2022-09-27 LAB — COMPREHENSIVE METABOLIC PANEL
ALT: 13 U/L (ref 0–44)
AST: 20 U/L (ref 15–41)
Albumin: 4.7 g/dL (ref 3.5–5.0)
Alkaline Phosphatase: 95 U/L (ref 38–126)
Anion gap: 12 (ref 5–15)
BUN: 17 mg/dL (ref 6–20)
CO2: 32 mmol/L (ref 22–32)
Calcium: 9.5 mg/dL (ref 8.9–10.3)
Chloride: 92 mmol/L — ABNORMAL LOW (ref 98–111)
Creatinine, Ser: 1.23 mg/dL (ref 0.61–1.24)
GFR, Estimated: >60 mL/min (ref >60)
Glucose, Bld: 113 mg/dL — ABNORMAL HIGH (ref 70–99)
Potassium: 3.2 mmol/L — ABNORMAL LOW (ref 3.5–5.1)
Sodium: 136 mmol/L (ref 135–145)
Total Bilirubin: 0.6 mg/dL (ref 0.3–1.2)
Total Protein: 8.9 g/dL — ABNORMAL HIGH (ref 6.5–8.1)

## 2022-09-27 LAB — CBC WITH DIFFERENTIAL/PLATELET
Abs Immature Granulocytes: 0.02 10*3/uL (ref 0.00–0.07)
Basophils Absolute: 0 10*3/uL (ref 0.0–0.1)
Basophils Relative: 0 %
Eosinophils Absolute: 0 10*3/uL (ref 0.0–0.5)
Eosinophils Relative: 0 %
HCT: 43.7 % (ref 39.0–52.0)
Hemoglobin: 13.6 g/dL (ref 13.0–17.0)
Immature Granulocytes: 0 %
Lymphocytes Relative: 29 %
Lymphs Abs: 2.7 10*3/uL (ref 0.7–4.0)
MCH: 23.4 pg — ABNORMAL LOW (ref 26.0–34.0)
MCHC: 31.1 g/dL (ref 30.0–36.0)
MCV: 75.2 fL — ABNORMAL LOW (ref 80.0–100.0)
Monocytes Absolute: 0.7 10*3/uL (ref 0.1–1.0)
Monocytes Relative: 8 %
Neutro Abs: 5.7 10*3/uL (ref 1.7–7.7)
Neutrophils Relative %: 63 %
Platelets: 222 10*3/uL (ref 150–400)
RBC: 5.81 MIL/uL (ref 4.22–5.81)
RDW: 14 % (ref 11.5–15.5)
WBC: 9.1 10*3/uL (ref 4.0–10.5)
nRBC: 0 % (ref 0.0–0.2)

## 2022-09-27 LAB — LIPASE, BLOOD: Lipase: 24 U/L (ref 11–51)

## 2022-09-27 MED ORDER — SODIUM CHLORIDE 0.9 % IV BOLUS
1000.0000 mL | Freq: Once | INTRAVENOUS | Status: AC
  Administered 2022-09-27: 1000 mL via INTRAVENOUS

## 2022-09-27 MED ORDER — ONDANSETRON HCL 4 MG/2ML IJ SOLN
4.0000 mg | Freq: Once | INTRAMUSCULAR | Status: AC
  Administered 2022-09-27: 4 mg via INTRAVENOUS
  Filled 2022-09-27: qty 2

## 2022-09-27 MED ORDER — ONDANSETRON HCL 4 MG PO TABS
4.0000 mg | ORAL_TABLET | Freq: Four times a day (QID) | ORAL | 0 refills | Status: DC
Start: 2022-09-27 — End: 2024-05-27
  Filled 2022-09-27: qty 12, 3d supply, fill #0

## 2022-09-27 NOTE — ED Triage Notes (Signed)
Pt to ER with c/o nausea vomiting since Wednesday night.

## 2022-09-27 NOTE — ED Provider Notes (Signed)
[facility] EMERGENCY DEPARTMENT Provider Note   CSN: [id number] Arrival date & time: 09/27/22  1009     History  Chief Complaint  Patient presents with   Emesis   Nausea    Jason Hess is a 26 y.o. male.  Patient here with nausea and vomiting last couple days.  History of marijuana use.  Denies any diarrhea.  Denies any abdominal pain.  Denies any suspicious food intake.  Nothing makes it worse or better.  No fever or chills.  No weakness or numbness.  The history is provided by the patient.       Home Medications Prior to Admission medications   Medication Sig Start Date End Date Taking? Authorizing Provider  ondansetron (ZOFRAN) 4 MG tablet Take 1 tablet (4 mg total) by mouth every 6 (six) hours. 09/27/22  Yes Bryndan Bilyk, DO  Acetaminophen (CHLORASEPTIC SORE THROAT PO) Take 1-2 sprays by mouth daily as needed (sore throat).    [provider]  acetaminophen (TYLENOL) 325 MG tablet Take 650 mg by mouth every 6 (six) hours as needed for mild pain, fever or headache.    [provider]  dicyclomine (BENTYL) 20 MG tablet Take 1 tablet (20 mg total) by mouth 2 (two) times daily. 12/07/21   Cristopher Peru, PA-C  potassium chloride (KLOR-CON) 10 MEQ tablet Take 1 tablet (10 mEq total) by mouth daily for 3 days. 06/11/22 06/14/22  Sloan Leiter, DO  promethazine (PHENERGAN) 50 MG tablet Take 0.5 tablets (25 mg total) by mouth every 6 (six) hours as needed for nausea or vomiting. 10/03/20   Liberty Handy, PA-C  sucralfate (CARAFATE) 1 g tablet Take 1 tablet (1 g total) by mouth 4 (four) times daily -  with meals and at bedtime for 7 days. 06/11/22 06/18/22  Sloan Leiter, DO      Allergies    Patient has no known allergies.    Review of Systems   Review of Systems  Physical Exam Updated Vital Signs BP (!) 143/96 (BP Location: Right Arm)   Pulse 60   Temp 98.4 F (36.9 C) (Oral)   Resp 16   Ht 5\' 10"  (1.778 m)   Wt 99.8 kg   SpO2 95%   BMI  31.57 kg/m  Physical Exam Vitals and nursing note reviewed.  Constitutional:      General: He is not in acute distress.    Appearance: He is well-developed.  HENT:     Head: Normocephalic and atraumatic.     Nose: Nose normal.     Mouth/Throat:     Mouth: Mucous membranes are moist.  Eyes:     Extraocular Movements: Extraocular movements intact.     Conjunctiva/sclera: Conjunctivae normal.     Pupils: Pupils are equal, round, and reactive to light.  Cardiovascular:     Rate and Rhythm: Normal rate and regular rhythm.     Heart sounds: No murmur heard. Pulmonary:     Effort: Pulmonary effort is normal. No respiratory distress.     Breath sounds: Normal breath sounds.  Abdominal:     General: Abdomen is flat.     Palpations: Abdomen is soft.     Tenderness: There is no abdominal tenderness.  Musculoskeletal:        General: No swelling.     Cervical back: Neck supple.  Skin:    General: Skin is warm and dry.     Capillary Refill: Capillary refill takes less than 2 seconds.  Neurological:     Mental Status: He is alert.  Psychiatric:        Mood and Affect: Mood normal.     ED Results / Procedures / Treatments   Labs (all labs ordered are listed, but only abnormal results are displayed) Labs Reviewed  CBC WITH DIFFERENTIAL/PLATELET - Abnormal; Notable for the following components:      Result Value   MCV 75.2 (*)    MCH 23.4 (*)    All other components within normal limits  COMPREHENSIVE METABOLIC PANEL - Abnormal; Notable for the following components:   Potassium 3.2 (*)    Chloride 92 (*)    Glucose, Bld 113 (*)    Total Protein 8.9 (*)    All other components within normal limits  LIPASE, BLOOD    EKG None  Radiology No results found.  Procedures Procedures    Medications Ordered in ED Medications  sodium chloride 0.9 % bolus 1,000 mL (1,000 mLs Intravenous New Bag/Given 09/27/22 1041)  ondansetron (ZOFRAN) injection 4 mg (4 mg Intravenous Given  09/27/22 1042)    ED Course/ Medical Decision Making/ A&P                           Medical Decision Making Amount and/or Complexity of Data Reviewed Labs: ordered.  Risk Prescription drug management.   Jason Hess is here with nausea and vomiting.  Normal vitals.  No fever.  Peers to have a history of hyperemesis from marijuana in the past.  He has no abdominal tenderness.  Have no concern for appendicitis or cholecystitis or pancreatitis but will get CBC, CMP and lipase to evaluate for these.  My suspicion that this is hyperemesis from marijuana or viral process or foodborne illness.  Will give IV fluids, IV Zofran and reevaluate.  Per my review and interpretation the labs is no significant anemia, electrolyte abnormality or kidney injury.  He is feeling better after IV fluids and IV Zofran.  Suspect hyperemesis from marijuana or viral GI illness.  Discharged in good condition.  Has no abdominal tenderness on repeat exam.  No concern for intra-abdominal process otherwise.  This chart was dictated using voice recognition software.  Despite best efforts to proofread,  errors can occur which can change the documentation meaning.         Final Clinical Impression(s) / ED Diagnoses Final diagnoses:  Vomiting without nausea, unspecified vomiting type    Rx / DC Orders ED Discharge Orders          Ordered    ondansetron (ZOFRAN) 4 MG tablet  Every 6 hours        09/27/22 1125              Virgina Norfolk, DO 09/27/22 1126

## 2022-12-09 IMAGING — CT CT ABD-PELV W/ CM
2 of 4 series · 17 of 46 positions shown, 19 images · IV contrast (omnipaque)
Comparison: Chest x-ray 10/03/2020

CLINICAL DATA: Acute nonlocalized abdominal pain. Vomiting and
fever. Sore throat.

EXAM:
CT ABDOMEN AND PELVIS WITH CONTRAST
TECHNIQUE: Multidetector CT imaging of the abdomen and pelvis was performed
using the standard protocol following bolus administration of
intravenous contrast.
CONTRAST:  100mL OMNIPAQUE IOHEXOL 300 MG/ML  SOLN

[Series 2: axial st · axial · 0.90mm/px · z∈[+964,+1394]mm · 14 of 98 slices shown, 16 images]
[im 6/98  soft-tissue]
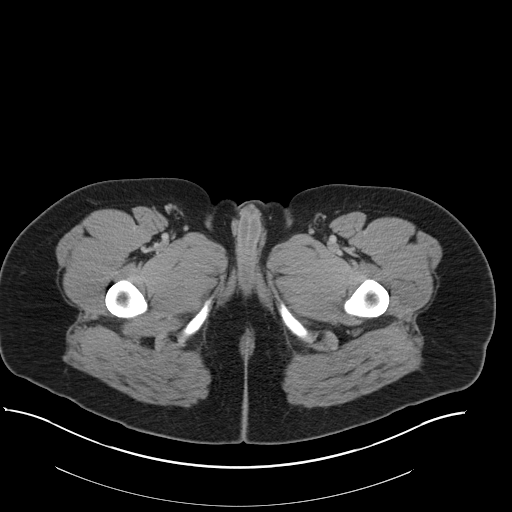
[im 6/98  bone]
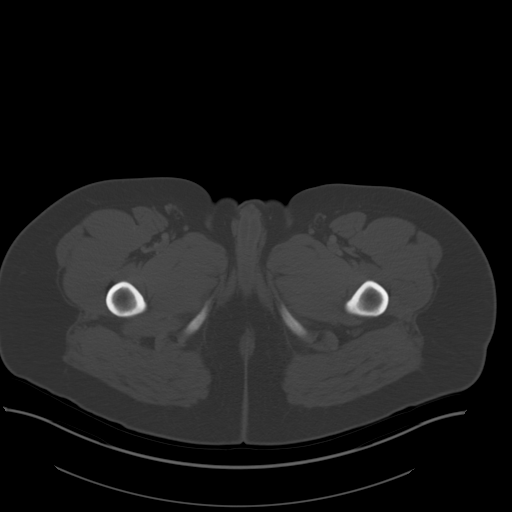
[im 11/98  soft-tissue]
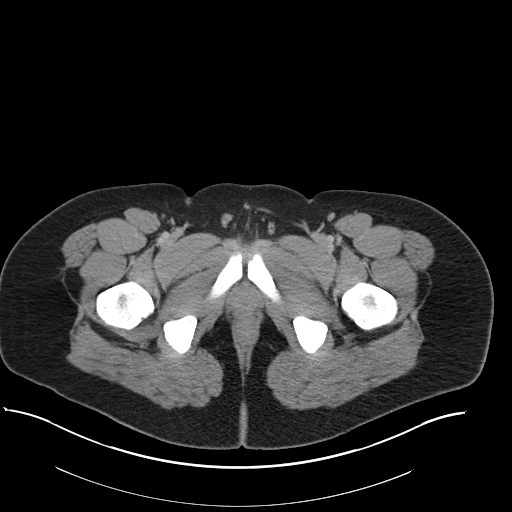
[im 21/98  soft-tissue]
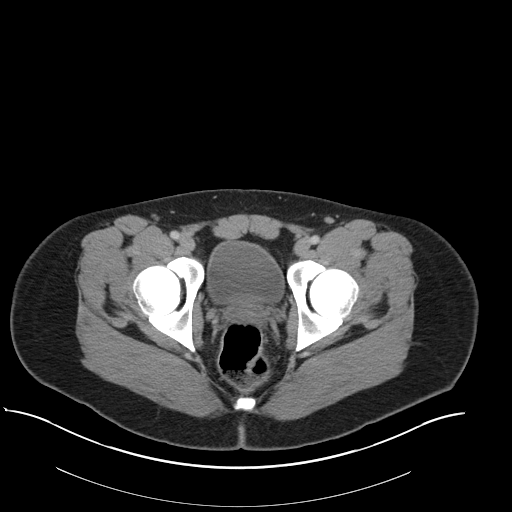
[im 26/98  soft-tissue]
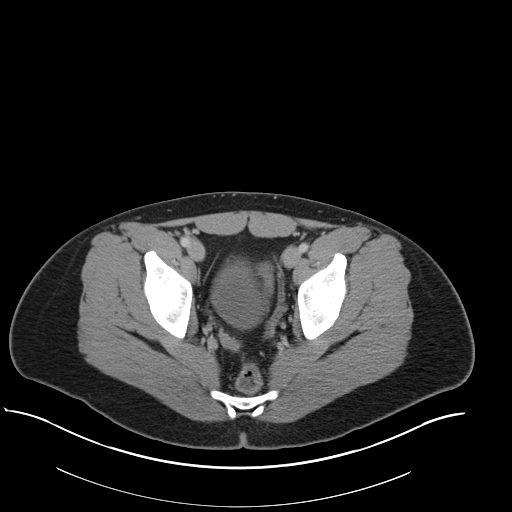
[im 31/98  soft-tissue]
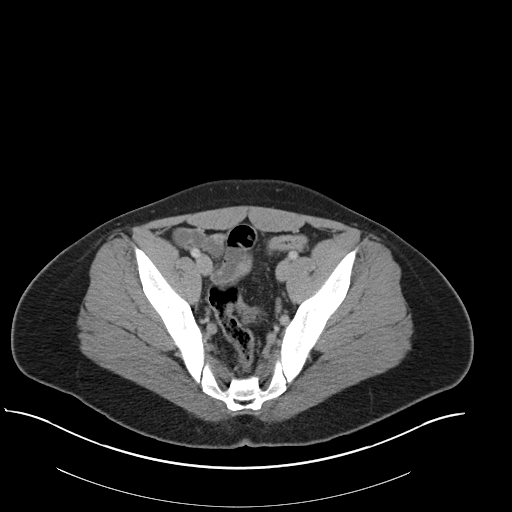
[im 41/98  soft-tissue]
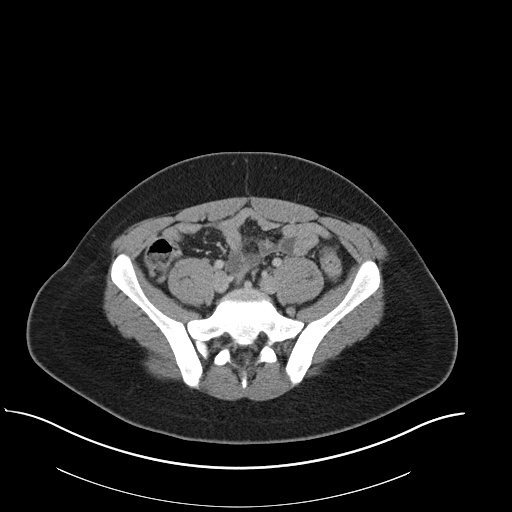
[im 46/98  soft-tissue]
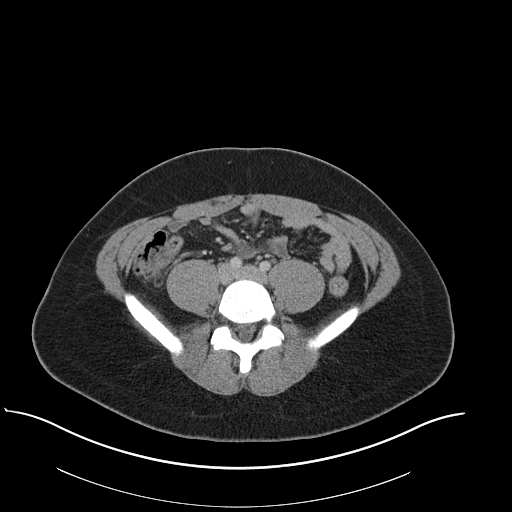
[im 52/98  soft-tissue]
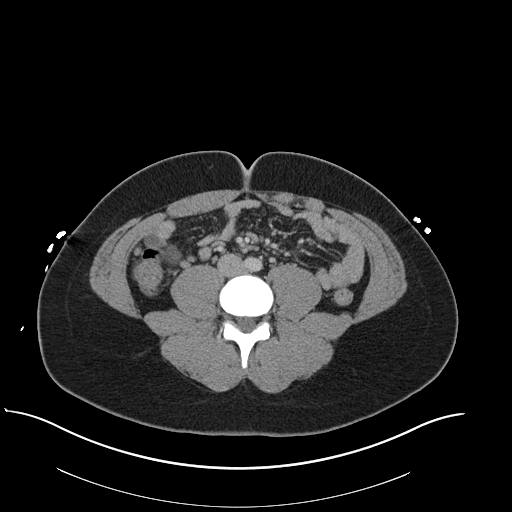
[im 57/98  soft-tissue]
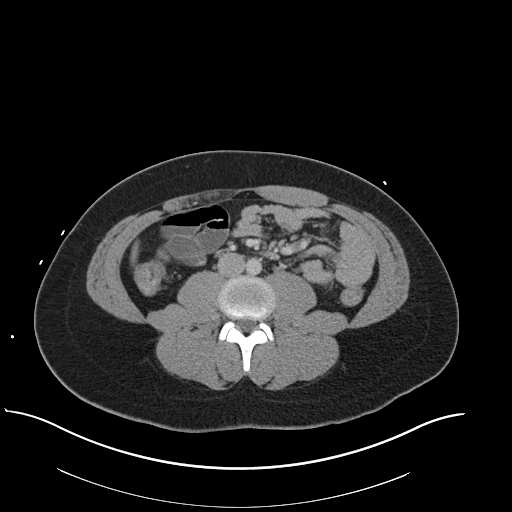
[im 57/98  bone]
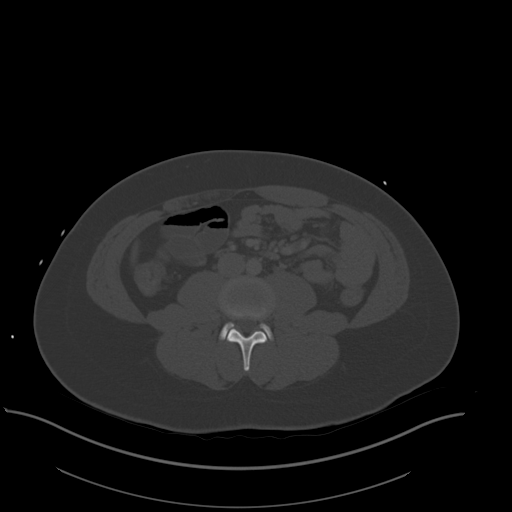
[im 67/98  soft-tissue]
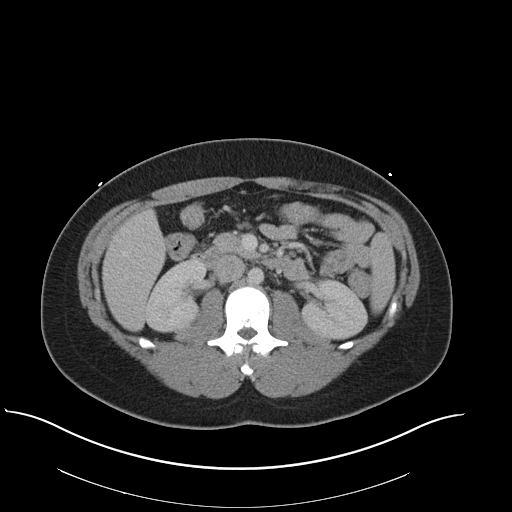
[im 72/98  soft-tissue]
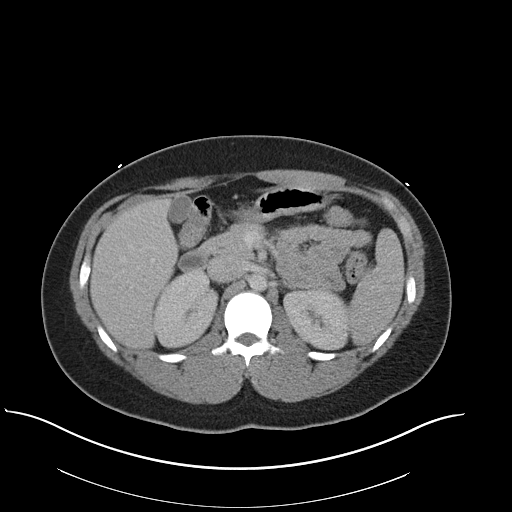
[im 77/98  soft-tissue]
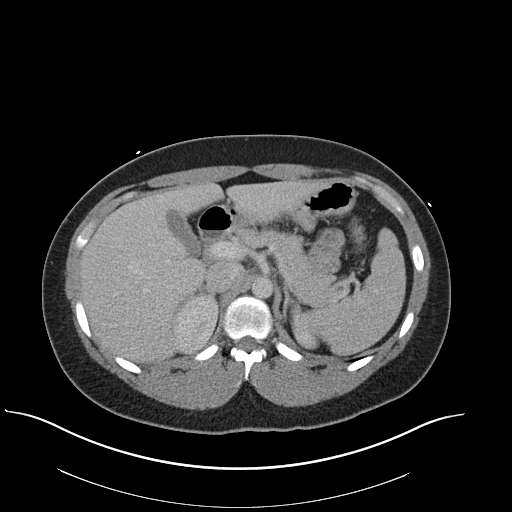
[im 87/98  soft-tissue]
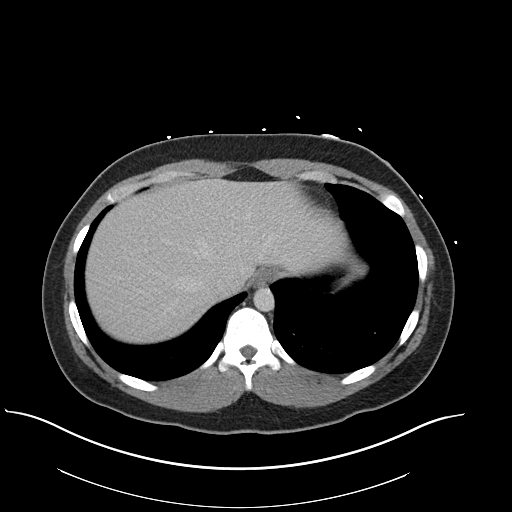
[im 92/98  soft-tissue]
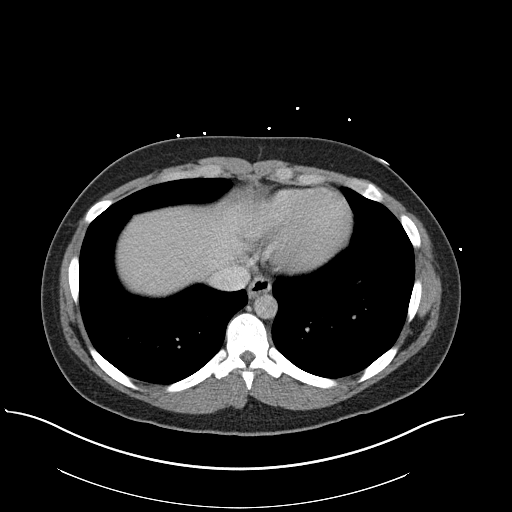

[Series 4: coronal st · coronal · 0.89mm/px · 3 of 151 slices shown]
[im 51/151  soft-tissue]
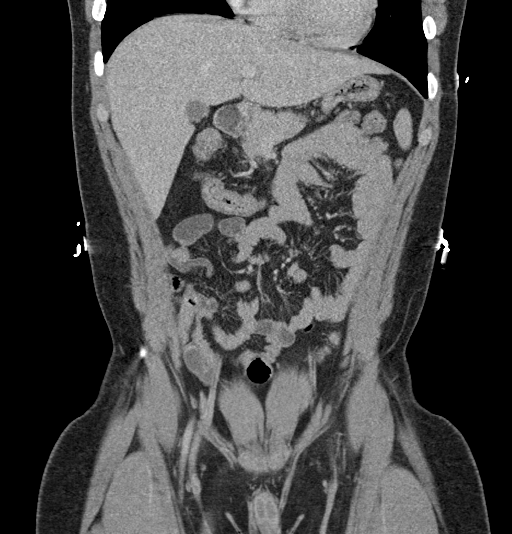
[im 67/151  soft-tissue]
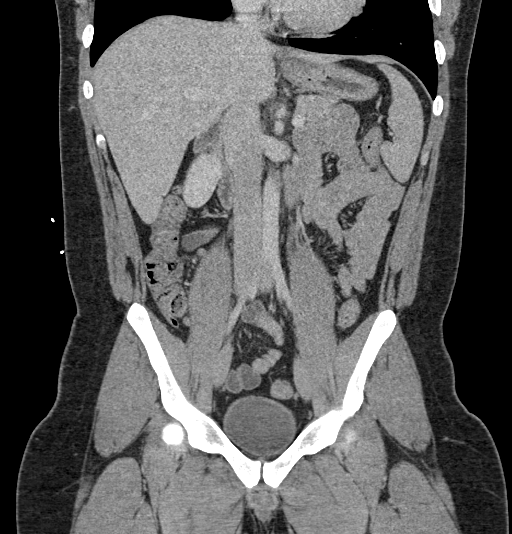
[im 84/151  soft-tissue]
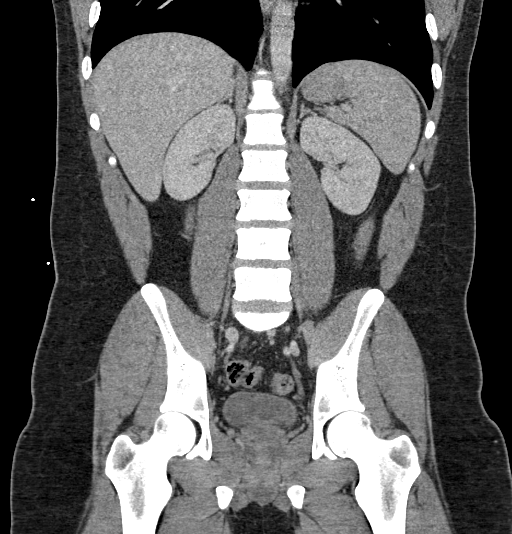

[17 of 46 positions shown; findings below may reference images not displayed]

FINDINGS: Lower chest: No acute abnormality.

Hepatobiliary: No focal liver abnormality. No gallstones,
gallbladder wall thickening, or pericholecystic fluid. No biliary
dilatation.

Pancreas: Vague hypodensity along the false form ligament likely
represents focal fatty infiltration. Otherwise no focal lesion.
Normal pancreatic contour. No surrounding inflammatory changes. No
main pancreatic ductal dilatation.

Spleen: Normal in size without focal abnormality.

Adrenals/Urinary Tract: No adrenal nodule bilaterally. Bilateral
kidneys enhance symmetrically. No hydronephrosis. No hydroureter.
The urinary bladder is unremarkable.

Stomach/Bowel: Stomach is within normal limits. No evidence of bowel
wall thickening or dilatation. Appendix appears normal.

Vascular/Lymphatic: No abdominal aorta or iliac aneurysm. No
abdominal, pelvic, or inguinal lymphadenopathy.

Reproductive: Prostate is unremarkable.

Other: No intraperitoneal free fluid. No intraperitoneal free gas.
No organized fluid collection.

Musculoskeletal:

No abdominal wall hernia or abnormality

No suspicious lytic or blastic osseous lesions. No acute displaced
fracture.
IMPRESSION: No acute intra-abdominal or intrapelvic abnormality.

## 2022-12-09 IMAGING — DX DG CHEST 1V PORT
1 series · 1 of 1 positions shown · non-contrast
Comparison: None.

CLINICAL DATA: Fever

EXAM:
PORTABLE CHEST 1 VIEW

[chest ap]
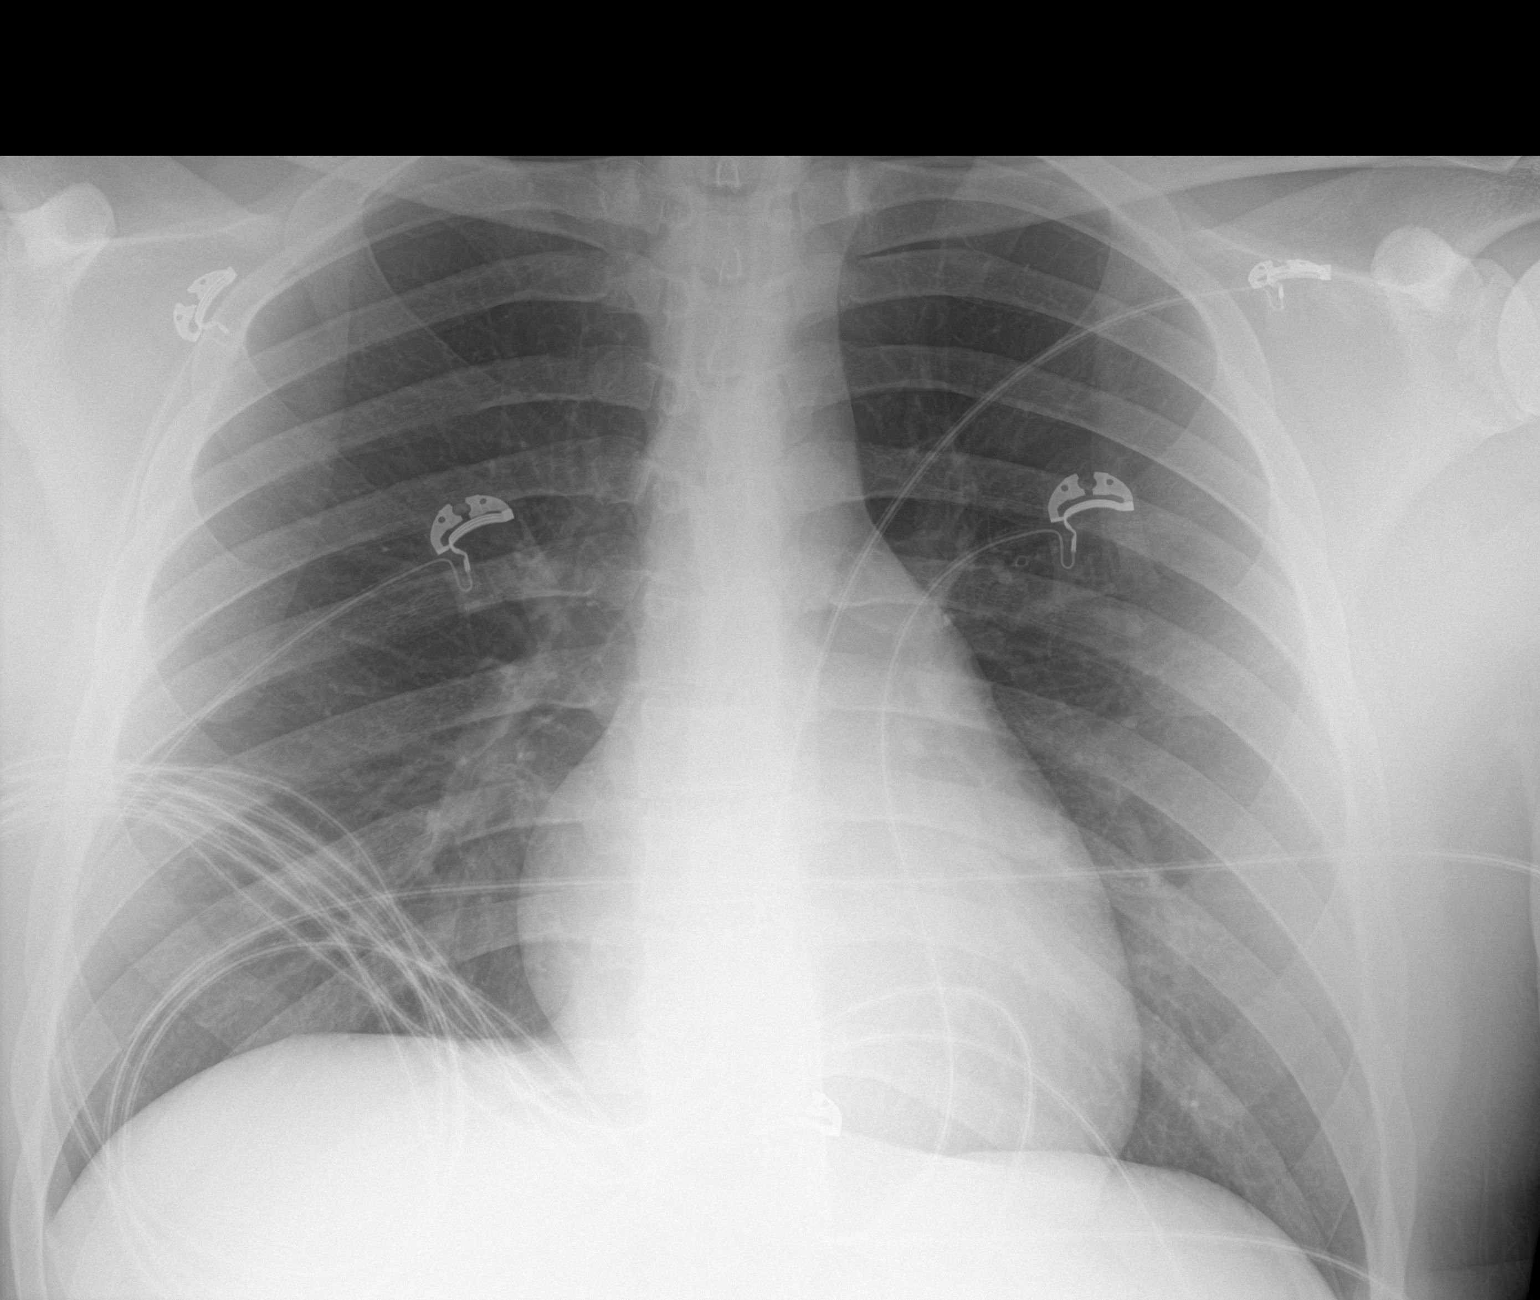

[1 of 1 positions shown; findings below may reference images not displayed]

FINDINGS: No focal consolidation. No pneumothorax or pleural effusion.
Cardiomediastinal silhouette is within normal limits. No acute
osseous abnormality.
IMPRESSION: No focal airspace disease.

## 2023-01-14 ENCOUNTER — Ambulatory Visit
Admission: EM | Admit: 2023-01-14 | Discharge: 2023-01-14 | Disposition: A | Discharge status: Home / Self Care | Payer: 59 | Attending: Internal Medicine | Admitting: Internal Medicine

## 2023-01-14 DIAGNOSIS — A084 Viral intestinal infection, unspecified: Secondary | ICD-10-CM | POA: Diagnosis not present

## 2023-01-14 DIAGNOSIS — R112 Nausea with vomiting, unspecified: Secondary | ICD-10-CM

## 2023-01-14 LAB — POCT FASTING CBG KUC MANUAL ENTRY: POCT Glucose (KUC): 95 mg/dL (ref 70–99)

## 2023-01-14 MED ORDER — ONDANSETRON 4 MG PO TBDP: 4.0000 mg | ORAL_TABLET | Freq: Three times a day (TID) | ORAL | 0 refills | Status: DC | PRN

## 2023-01-14 NOTE — Discharge Instructions (Signed)
Blood glucose was normal.  Suspect you have a viral illness causing your symptoms.  A nausea medication has been prescribed.  Ensure adequate fluid hydration and bland diet.  Follow-up with the ER if symptoms persist or worsen.

## 2023-01-14 NOTE — ED Triage Notes (Signed)
Pt c/o vomiting, and abd pain that comes intermittently.   Started: yesterday   Home interventions: none

## 2023-01-14 NOTE — ED Provider Notes (Signed)
[facility]    CSN: [id number] Arrival date & time: 01/14/23  0930      History   Chief Complaint Chief Complaint  Patient presents with   Vomiting   Abdominal Pain    HPI Jason Hess is a 27 y.o. male.   Patient presents with nausea, vomiting, diarrhea, abdominal pain that started yesterday.  Patient denies any known sick contacts or fever.  Denies any associated upper respiratory symptoms or cough.  Denies any recent unfavorable foods or travel outside the [place].  Patient has been having difficulty keeping food and fluids down.  Reports abdominal pain is intermittent and generalized.  Patient not reporting any blood in stool or emesis.  Patient does report that he smokes marijuana but does not smoke daily.   Abdominal Pain   History reviewed. No pertinent past medical history.  There are no problems to display for this patient.   History reviewed. No pertinent surgical history.     Home Medications    Prior to Admission medications   Medication Sig Start Date End Date Taking? Authorizing Provider  ondansetron (ZOFRAN-ODT) 4 MG disintegrating tablet Take 1 tablet (4 mg total) by mouth every 8 (eight) hours as needed for nausea or vomiting. 01/14/23  Yes Annalucia Laino, Hildred Alamin E, FNP  Acetaminophen (CHLORASEPTIC SORE THROAT PO) Take 1-2 sprays by mouth daily as needed (sore throat).    [provider]  acetaminophen (TYLENOL) 325 MG tablet Take 650 mg by mouth every 6 (six) hours as needed for mild pain, fever or headache.    [provider]  dicyclomine (BENTYL) 20 MG tablet Take 1 tablet (20 mg total) by mouth 2 (two) times daily. 12/07/21   Mickie Hillier, PA-C  ondansetron (ZOFRAN) 4 MG tablet Take 1 tablet (4 mg total) by mouth every 6 (six) hours. 09/27/22   Curatolo, Adam, DO  potassium chloride (KLOR-CON) 10 MEQ tablet Take 1 tablet (10 mEq total) by mouth daily for 3 days. 06/11/22 06/14/22  Jeanell Sparrow, DO  promethazine (PHENERGAN) 50 MG  tablet Take 0.5 tablets (25 mg total) by mouth every 6 (six) hours as needed for nausea or vomiting. 10/03/20   Kinnie Feil, PA-C  sucralfate (CARAFATE) 1 g tablet Take 1 tablet (1 g total) by mouth 4 (four) times daily -  with meals and at bedtime for 7 days. 06/11/22 06/18/22  Jeanell Sparrow, DO    Family History History reviewed. No pertinent family history.  Social History Social History   Tobacco Use   Smoking status: Never   Smokeless tobacco: Never  Substance Use Topics   Alcohol use: Yes   Drug use: Not Currently    Frequency: 3.0 times per week    Types: Marijuana     Allergies   Patient has no known allergies.   Review of Systems Review of Systems Per HPI  Physical Exam Triage Vital Signs ED Triage Vitals  Enc Vitals Group     BP 01/14/23 1012 132/85     Pulse Rate 01/14/23 1012 84     Resp 01/14/23 1012 18     Temp 01/14/23 1012 98.5 F (36.9 C)     Temp Source 01/14/23 1012 Oral     SpO2 01/14/23 1012 98 %     Weight --      Height --      Head Circumference --      Peak Flow --      Pain Score 01/14/23 1010 3  Pain Loc --      Pain Edu? --      Excl. in [place]? --    No data found.  Updated Vital Signs BP 132/85 (BP Location: Left Arm)   Pulse 84   Temp 98.5 F (36.9 C) (Oral)   Resp 18   SpO2 98%   Visual Acuity Right Eye Distance:   Left Eye Distance:   Bilateral Distance:    Right Eye Near:   Left Eye Near:    Bilateral Near:     Physical Exam Constitutional:      General: He is not in acute distress.    Appearance: Normal appearance. He is not toxic-appearing or diaphoretic.  HENT:     Head: Normocephalic and atraumatic.     Mouth/Throat:     Mouth: Mucous membranes are moist.     Pharynx: No posterior oropharyngeal erythema.  Eyes:     Extraocular Movements: Extraocular movements intact.     Conjunctiva/sclera: Conjunctivae normal.  Cardiovascular:     Rate and Rhythm: Normal rate and regular rhythm.     Pulses:  Normal pulses.     Heart sounds: Normal heart sounds.  Pulmonary:     Effort: Pulmonary effort is normal. No respiratory distress.     Breath sounds: Normal breath sounds.  Abdominal:     General: Bowel sounds are normal. There is no distension.     Palpations: Abdomen is soft.     Tenderness: There is no abdominal tenderness.  Neurological:     General: No focal deficit present.     Mental Status: He is alert and oriented to person, place, and time. Mental status is at baseline.  Psychiatric:        Mood and Affect: Mood normal.        Behavior: Behavior normal.        Thought Content: Thought content normal.        Judgment: Judgment normal.      UC Treatments / Results  Labs (all labs ordered are listed, but only abnormal results are displayed) Labs Reviewed  POCT FASTING [facility] [place]    EKG   Radiology No results found.  Procedures Procedures (including critical care time)  Medications Ordered in UC Medications - No data to display  Initial Impression / Assessment and Plan / UC Course  I have reviewed the triage vital signs and the nursing notes.  Pertinent labs & imaging results that were available during my care of the patient were reviewed by me and considered in my medical decision making (see chart for details).     Differential diagnoses include viral gastroenteritis versus food related illness versus hyperemesis due to marijuana use.  Most suspicious of viral stomach virus as patient denies that he smokes marijuana daily.  No signs of acute abdomen or dehydration on exam so do not think that emergent evaluation or imaging is necessary at this time.  Will prescribe ondansetron for patient to take as needed for nausea.  Advised adequate fluid hydration and bland diet as well.  Patient requesting blood glucose to be checked today which was normal.  Advised strict return and ER precautions.  Patient verbalized understanding and was agreeable with  plan. Final Clinical Impressions(s) / UC Diagnoses   Final diagnoses:  Nausea vomiting and diarrhea  Viral gastroenteritis     Discharge Instructions      Blood glucose was normal.  Suspect you have a viral illness causing your symptoms.  A  nausea medication has been prescribed.  Ensure adequate fluid hydration and bland diet.  Follow-up with the ER if symptoms persist or worsen.    ED Prescriptions     Medication Sig Dispense Auth. Provider   ondansetron (ZOFRAN-ODT) 4 MG disintegrating tablet Take 1 tablet (4 mg total) by mouth every 8 (eight) hours as needed for nausea or vomiting. 20 tablet [place], Michele Rockers, [place]      PDMP not reviewed this encounter.   Teodora Medici, [place] 01/14/23 1101

## 2023-01-16 ENCOUNTER — Emergency Department (HOSPITAL_BASED_OUTPATIENT_CLINIC_OR_DEPARTMENT_OTHER)
Admission: EM | Admit: 2023-01-16 | Discharge: 2023-01-16 | Disposition: A | Discharge status: Home / Self Care | Payer: 59 | Attending: Emergency Medicine | Admitting: Emergency Medicine

## 2023-01-16 ENCOUNTER — Encounter (HOSPITAL_BASED_OUTPATIENT_CLINIC_OR_DEPARTMENT_OTHER): Payer: Self-pay

## 2023-01-16 ENCOUNTER — Other Ambulatory Visit: Payer: Self-pay

## 2023-01-16 DIAGNOSIS — Z79899 Other long term (current) drug therapy: Secondary | ICD-10-CM | POA: Diagnosis not present

## 2023-01-16 DIAGNOSIS — R112 Nausea with vomiting, unspecified: Secondary | ICD-10-CM

## 2023-01-16 DIAGNOSIS — E876 Hypokalemia: Secondary | ICD-10-CM | POA: Insufficient documentation

## 2023-01-16 DIAGNOSIS — R109 Unspecified abdominal pain: Secondary | ICD-10-CM | POA: Insufficient documentation

## 2023-01-16 LAB — COMPREHENSIVE METABOLIC PANEL
ALT: 15 U/L (ref 0–44)
AST: 19 U/L (ref 15–41)
Albumin: 4.6 g/dL (ref 3.5–5.0)
Alkaline Phosphatase: 85 U/L (ref 38–126)
Anion gap: 14 (ref 5–15)
BUN: 16 mg/dL (ref 6–20)
CO2: 25 mmol/L (ref 22–32)
Calcium: 9.3 mg/dL (ref 8.9–10.3)
Chloride: 93 mmol/L — ABNORMAL LOW (ref 98–111)
Creatinine, Ser: 1.05 mg/dL (ref 0.61–1.24)
GFR, Estimated: >60 mL/min (ref >60)
Glucose, Bld: 107 mg/dL — ABNORMAL HIGH (ref 70–99)
Potassium: 3 mmol/L — ABNORMAL LOW (ref 3.5–5.1)
Sodium: 132 mmol/L — ABNORMAL LOW (ref 135–145)
Total Bilirubin: 0.9 mg/dL (ref 0.3–1.2)
Total Protein: 8.6 g/dL — ABNORMAL HIGH (ref 6.5–8.1)

## 2023-01-16 LAB — CBC
HCT: 45 % (ref 39.0–52.0)
Hemoglobin: 13.8 g/dL (ref 13.0–17.0)
MCH: 23.2 pg — ABNORMAL LOW (ref 26.0–34.0)
MCHC: 30.7 g/dL (ref 30.0–36.0)
MCV: 75.6 fL — ABNORMAL LOW (ref 80.0–100.0)
Platelets: 237 10*3/uL (ref 150–400)
RBC: 5.95 MIL/uL — ABNORMAL HIGH (ref 4.22–5.81)
RDW: 14 % (ref 11.5–15.5)
WBC: 8.1 10*3/uL (ref 4.0–10.5)
nRBC: 0 % (ref 0.0–0.2)

## 2023-01-16 LAB — RAPID URINE DRUG SCREEN, HOSP PERFORMED
Amphetamines: NOT DETECTED
Barbiturates: NOT DETECTED
Benzodiazepines: NOT DETECTED
Cocaine: NOT DETECTED
Opiates: NOT DETECTED
Tetrahydrocannabinol: POSITIVE — AB

## 2023-01-16 LAB — URINALYSIS, ROUTINE W REFLEX MICROSCOPIC
Glucose, UA: NEGATIVE mg/dL
Ketones, ur: >=80 mg/dL — AB
Leukocytes,Ua: NEGATIVE
Nitrite: NEGATIVE
Protein, ur: 100 mg/dL — AB
Specific Gravity, Urine: >=1.03 (ref 1.005–1.030)
pH: 5.5 (ref 5.0–8.0)

## 2023-01-16 LAB — LIPASE, BLOOD: Lipase: 28 U/L (ref 11–51)

## 2023-01-16 LAB — URINALYSIS, MICROSCOPIC (REFLEX)

## 2023-01-16 LAB — MAGNESIUM: Magnesium: 1.8 mg/dL (ref 1.7–2.4)

## 2023-01-16 MED ORDER — FAMOTIDINE 20 MG PO TABS
20.0000 mg | ORAL_TABLET | Freq: Once | ORAL | Status: AC
  Administered 2023-01-16: 20 mg via ORAL
  Filled 2023-01-16: qty 1

## 2023-01-16 MED ORDER — ALUM & MAG HYDROXIDE-SIMETH 200-200-20 MG/5ML PO SUSP
15.0000 mL | Freq: Once | ORAL | Status: AC
  Administered 2023-01-16: 15 mL via ORAL
  Filled 2023-01-16: qty 30

## 2023-01-16 MED ORDER — PROMETHAZINE HCL 25 MG RE SUPP: 25.0000 mg | Freq: Four times a day (QID) | RECTAL | 0 refills | Status: DC | PRN

## 2023-01-16 MED ORDER — SODIUM CHLORIDE 0.9 % IV BOLUS
1000.0000 mL | Freq: Once | INTRAVENOUS | Status: AC
  Administered 2023-01-16: 1000 mL via INTRAVENOUS

## 2023-01-16 MED ORDER — ONDANSETRON HCL 4 MG/2ML IJ SOLN
4.0000 mg | Freq: Once | INTRAMUSCULAR | Status: AC
  Administered 2023-01-16: 4 mg via INTRAVENOUS
  Filled 2023-01-16: qty 2

## 2023-01-16 MED ORDER — POTASSIUM CHLORIDE CRYS ER 20 MEQ PO TBCR
40.0000 meq | EXTENDED_RELEASE_TABLET | Freq: Once | ORAL | Status: AC
  Administered 2023-01-16: 40 meq via ORAL
  Filled 2023-01-16: qty 2

## 2023-01-16 NOTE — ED Provider Notes (Signed)
[facility] Provider Note   CSN: [id number] Arrival date & time: 01/16/23  1001     History  Chief Complaint  Patient presents with   Abdominal Pain    Jason Hess is a 27 y.o. male.   Abdominal Pain   27 year old male presents emergency department with complaints of nausea, vomiting, abdominal discomfort.  Patient states that symptoms are present since Monday of this week.  Reports 4-5 episodes of emesis per day since onset.  Was seen at urgent care on Tuesday and given Zofran to take as needed for nausea/vomiting of which she states has been helping some but has noted some persistent vomiting.  Reports continued use of cannabis throughout illness because "I wanted to stimulate my appetite" of which has not helped with nausea or vomiting.  Reports last alcohol consumption was on Monday prior to symptom onset.  States has been able to tolerate liquids but has had difficulty with more solid foods.  Denies fever, chills, cough, congestion, chest pain, shortness of breath, urinary symptoms, change in bowel habits.  Last bowel movement this morning without evidence of hematochezia/melena.  No prior abdominal surgeries per patient.  No significant pertinent past medical history.  Home Medications Prior to Admission medications   Medication Sig Start Date End Date Taking? Authorizing Provider  ondansetron (ZOFRAN) 4 MG tablet Take 1 tablet (4 mg total) by mouth every 6 (six) hours. 09/27/22  Yes Curatolo, Adam, DO  promethazine (PHENERGAN) 25 MG suppository Place 1 suppository (25 mg total) rectally every 6 (six) hours as needed for nausea or vomiting. 01/16/23  Yes Dion Saucier A, PA  Acetaminophen (CHLORASEPTIC SORE THROAT PO) Take 1-2 sprays by mouth daily as needed (sore throat).    [provider]  acetaminophen (TYLENOL) 325 MG tablet Take 650 mg by mouth every 6 (six) hours as needed for mild pain, fever or headache.    [provider]  dicyclomine (BENTYL) 20 MG tablet Take 1 tablet (20 mg total) by mouth 2 (two) times daily. 12/07/21   Mickie Hillier, PA-C  ondansetron (ZOFRAN-ODT) 4 MG disintegrating tablet Take 1 tablet (4 mg total) by mouth every 8 (eight) hours as needed for nausea or vomiting. 01/14/23   Teodora Medici, FNP  potassium chloride (KLOR-CON) 10 MEQ tablet Take 1 tablet (10 mEq total) by mouth daily for 3 days. 06/11/22 06/14/22  Jeanell Sparrow, DO  sucralfate (CARAFATE) 1 g tablet Take 1 tablet (1 g total) by mouth 4 (four) times daily -  with meals and at bedtime for 7 days. 06/11/22 06/18/22  Jeanell Sparrow, DO      Allergies    Patient has no known allergies.    Review of Systems   Review of Systems  Gastrointestinal:  Positive for abdominal pain.  All other systems reviewed and are negative.   Physical Exam Updated Vital Signs BP (!) 143/106   Pulse (!) 55   Temp 99 F (37.2 C)   Resp 18   Ht 5\' 10"  (1.778 m)   Wt 93.9 kg   SpO2 100%   BMI 29.70 kg/m  Physical Exam Vitals and nursing note reviewed.  Constitutional:      General: He is not in acute distress.    Appearance: He is well-developed.  HENT:     Head: Normocephalic and atraumatic.  Eyes:     Conjunctiva/sclera: Conjunctivae normal.  Cardiovascular:     Rate and Rhythm: Normal rate and  regular rhythm.     Heart sounds: No murmur heard. Pulmonary:     Effort: Pulmonary effort is normal. No respiratory distress.     Breath sounds: Normal breath sounds.  Abdominal:     Palpations: Abdomen is soft.     Tenderness: There is abdominal tenderness. There is no right CVA tenderness, left CVA tenderness or guarding. Negative signs include Murphy's sign and McBurney's sign.     Comments: Mild epigastric tenderness to palpation.  Musculoskeletal:        General: No swelling.     Cervical back: Neck supple.  Skin:    General: Skin is warm and dry.     Capillary Refill: Capillary refill takes less than 2 seconds.   Neurological:     Mental Status: He is alert.  Psychiatric:        Mood and Affect: Mood normal.     ED Results / Procedures / Treatments   Labs (all labs ordered are listed, but only abnormal results are displayed) Labs Reviewed  COMPREHENSIVE METABOLIC PANEL - Abnormal; Notable for the following components:      Result Value   Sodium 132 (*)    Potassium 3.0 (*)    Chloride 93 (*)    Glucose, Bld 107 (*)    Total Protein 8.6 (*)    All other components within normal limits  CBC - Abnormal; Notable for the following components:   RBC 5.95 (*)    MCV 75.6 (*)    MCH 23.2 (*)    All other components within normal limits  URINALYSIS, ROUTINE W REFLEX MICROSCOPIC - Abnormal; Notable for the following components:   Hgb urine dipstick SMALL (*)    Bilirubin Urine SMALL (*)    Ketones, ur >=80 (*)    Protein, ur 100 (*)    All other components within normal limits  RAPID URINE DRUG SCREEN, HOSP PERFORMED - Abnormal; Notable for the following components:   Tetrahydrocannabinol POSITIVE (*)    All other components within normal limits  URINALYSIS, MICROSCOPIC (REFLEX) - Abnormal; Notable for the following components:   Bacteria, UA RARE (*)    All other components within normal limits  LIPASE, BLOOD  MAGNESIUM    EKG None  Radiology No results found.  Procedures Procedures    Medications Ordered in ED Medications  ondansetron (ZOFRAN) injection 4 mg (4 mg Intravenous Given 01/16/23 1041)  sodium chloride 0.9 % bolus 1,000 mL (1,000 mLs Intravenous New Bag/Given 01/16/23 1041)  alum & mag hydroxide-simeth (MAALOX/MYLANTA) 200-200-20 MG/5ML suspension 15 mL (15 mLs Oral Given 01/16/23 1130)  famotidine (PEPCID) tablet 20 mg (20 mg Oral Given 01/16/23 1130)  potassium chloride SA (KLOR-CON M) CR tablet 40 mEq (40 mEq Oral Given 01/16/23 1130)    ED Course/ Medical Decision Making/ A&P                             Medical Decision Making Amount and/or Complexity of Data  Reviewed Labs: ordered.  Risk OTC drugs. Prescription drug management.   This patient presents to the ED for concern of nausea, vomiting, this involves an extensive number of treatment options, and is a complaint that carries with it a high risk of complications and morbidity.  The differential diagnosis includes gastritis/PUD, pancreatitis, CBD pathology, cholecystitis, hepatitis, SBO/LBO, volvulus, appendicitis, diverticulitis, pyelonephritis, nephrolithiasis, cystitis, mesenteric ischemia, AAA, aortic dissection, hyperemesis cannabinoid syndrome, viral gastroenteritis   Co morbidities that complicate the patient  evaluation  See HPI   Additional history obtained:  Additional history obtained from EMR External records from outside source obtained and reviewed including hospital records   Lab Tests:  I Ordered, and personally interpreted labs.  The pertinent results include: Patient with mild hyponatremia, hypokalemia, hypochloremia of 132, 3.0 93 respectively.  Patient given 1 L normal saline as well as potassium supplementation of 40 mill equivalents of potassium while in the emergency department.  Patient's magnesium within normal limits.  No transaminitis.  No renal dysfunction.  Patient without leukocytosis, evidence of anemia or platelet abnormalities.  UA significant for 100 protein, greater than 80 ketones, small bilirubin and small hemoglobin but otherwise unremarkable.  UDS positive for THC.   Imaging Studies ordered:  N/a   Cardiac Monitoring: / EKG:  The patient was maintained on a cardiac monitor.  I personally viewed and interpreted the cardiac monitored which showed an underlying rhythm of: Sinus rhythm   Consultations Obtained:  N/a   Problem List / ED Course / Critical interventions / Medication management  Nausea, vomiting I ordered medication including 1 L normal saline, Zofran, potassium chloride, Pepcid, Maalox   Reevaluation of the patient after  these medicines showed that the patient improved I have reviewed the patients home medicines and have made adjustments as needed   Social Determinants of Health:  Chronic marijuana use.   Test / Admission - Considered:  Nausea, vomiting Vitals signs within normal range and stable throughout visit. Laboratory studies significant for: See above 27 year old male presents emergency department with nausea and vomiting.  Patient been seen emergency department multiple times for similar symptoms most likely associated with marijuana use more chronically.  Patient's current presentation aligns with at home marijuana use with possible viral gastroenteritis.  Initial exam showed mild epigastric tenderness of which was relieved/resolved with administration of GI cocktail.  Likely gastritis/lower esophagitis secondary to patient's persistent vomiting.  Patient without repeat episodes of emesis while in the emergency department after administration of antiemetic.  Will continue at home Zofran with rectal Phenergan to take as needed for nausea and vomiting.  Low suspicion for emergent intra-abdominal process given repeat benign abdominal exam and patient tolerating p.o..  Recommend cessation of at home cannabis use, antiemetic as needed with close follow-up with primary care.  Patient afebrile, well-appearing in no acute distress.  Strict return precautions were discussed at length.  Treatment plan discussed with patient and he acknowledged understanding was agreeable to said plan. Worrisome signs and symptoms were discussed with the patient, and the patient acknowledged understanding to return to the ED if noticed. Patient was stable upon discharge.          Final Clinical Impression(s) / ED Diagnoses Final diagnoses:  Nausea and vomiting, unspecified vomiting type  Hypokalemia    Rx / DC Orders ED Discharge Orders          Ordered    promethazine (PHENERGAN) 25 MG suppository  Every 6 hours PRN         01/16/23 1136              Peter Garter, [place] 01/16/23 1144    Blane Ohara, MD 01/18/23 2303

## 2023-01-16 NOTE — ED Triage Notes (Signed)
Pt reports abdominal pain increasing since visit at [facility] 4/2 2004. Last time he vomited 3am . No blood in vomit. Poor po intake. Pt completed course of abt for tooth infection

## 2023-01-16 NOTE — ED Notes (Signed)
Lab notified of magnesium order. °

## 2023-01-16 NOTE — ED Notes (Signed)
Reviewed discharge instructions and recommendations with pt. Pt states understanding. No vomiting noted

## 2023-01-16 NOTE — Discharge Instructions (Addendum)
Note the workup today was overall reassuring.  Symptoms most likely secondary to marijuana use versus viral illness.  Will prescribe nausea/vomiting medicine in the form of Phenergan to take as needed if symptoms refractory to Zofran.  Your potassium was also low so I recommend doubling your at home potassium supplement for the next few days.  Recommend follow-up with primary care for reassessment of your symptoms.  Also recommend stopping use of marijuana as I think that this is attributing to your symptoms.  Please do not hesitate to return to emergency department for worrisome signs and symptoms we discussed become apparent.

## 2023-11-27 ENCOUNTER — Other Ambulatory Visit: Payer: Self-pay

## 2023-11-27 ENCOUNTER — Encounter (HOSPITAL_COMMUNITY): Payer: Self-pay | Admitting: Emergency Medicine

## 2023-11-27 ENCOUNTER — Emergency Department (HOSPITAL_COMMUNITY)
Admission: EM | Admit: 2023-11-27 | Discharge: 2023-11-27 | Disposition: A | Discharge status: Home / Self Care | Payer: BC Managed Care – PPO | Attending: Emergency Medicine | Admitting: Emergency Medicine

## 2023-11-27 DIAGNOSIS — R112 Nausea with vomiting, unspecified: Secondary | ICD-10-CM | POA: Diagnosis present

## 2023-11-27 DIAGNOSIS — E876 Hypokalemia: Secondary | ICD-10-CM | POA: Insufficient documentation

## 2023-11-27 LAB — COMPREHENSIVE METABOLIC PANEL
ALT: 14 U/L (ref 0–44)
AST: 19 U/L (ref 15–41)
Albumin: 4.8 g/dL (ref 3.5–5.0)
Alkaline Phosphatase: 97 U/L (ref 38–126)
Anion gap: 16 — ABNORMAL HIGH (ref 5–15)
BUN: 15 mg/dL (ref 6–20)
CO2: 33 mmol/L — ABNORMAL HIGH (ref 22–32)
Calcium: 9.9 mg/dL (ref 8.9–10.3)
Chloride: 84 mmol/L — ABNORMAL LOW (ref 98–111)
Creatinine, Ser: 1.12 mg/dL (ref 0.61–1.24)
GFR, Estimated: >60 mL/min (ref >60)
Glucose, Bld: 159 mg/dL — ABNORMAL HIGH (ref 70–99)
Potassium: 2.6 mmol/L — CL (ref 3.5–5.1)
Sodium: 133 mmol/L — ABNORMAL LOW (ref 135–145)
Total Bilirubin: 1.3 mg/dL — ABNORMAL HIGH (ref 0.0–1.2)
Total Protein: 9.1 g/dL — ABNORMAL HIGH (ref 6.5–8.1)

## 2023-11-27 LAB — CBC
HCT: 50.3 % (ref 39.0–52.0)
Hemoglobin: 15.5 g/dL (ref 13.0–17.0)
MCH: 23.6 pg — ABNORMAL LOW (ref 26.0–34.0)
MCHC: 30.8 g/dL (ref 30.0–36.0)
MCV: 76.6 fL — ABNORMAL LOW (ref 80.0–100.0)
Platelets: 213 10*3/uL (ref 150–400)
RBC: 6.57 MIL/uL — ABNORMAL HIGH (ref 4.22–5.81)
RDW: 13.9 % (ref 11.5–15.5)
WBC: 12.4 10*3/uL — ABNORMAL HIGH (ref 4.0–10.5)
nRBC: 0 % (ref 0.0–0.2)

## 2023-11-27 LAB — URINALYSIS, ROUTINE W REFLEX MICROSCOPIC
Bilirubin Urine: NEGATIVE
Glucose, UA: NEGATIVE mg/dL
Ketones, ur: NEGATIVE mg/dL
Leukocytes,Ua: NEGATIVE
Nitrite: NEGATIVE
Protein, ur: 100 mg/dL — AB
Specific Gravity, Urine: 1.024 (ref 1.005–1.030)
pH: 6 (ref 5.0–8.0)

## 2023-11-27 LAB — I-STAT CHEM 8, ED
BUN: 14 mg/dL (ref 6–20)
Calcium, Ion: 1.06 mmol/L — ABNORMAL LOW (ref 1.15–1.40)
Chloride: 87 mmol/L — ABNORMAL LOW (ref 98–111)
Creatinine, Ser: 1.2 mg/dL (ref 0.61–1.24)
Glucose, Bld: 99 mg/dL (ref 70–99)
HCT: 48 % (ref 39.0–52.0)
Hemoglobin: 16.3 g/dL (ref 13.0–17.0)
Potassium: 3.6 mmol/L (ref 3.5–5.1)
Sodium: 133 mmol/L — ABNORMAL LOW (ref 135–145)
TCO2: 39 mmol/L — ABNORMAL HIGH (ref 22–32)

## 2023-11-27 LAB — MAGNESIUM: Magnesium: 2.3 mg/dL (ref 1.7–2.4)

## 2023-11-27 LAB — LIPASE, BLOOD: Lipase: 27 U/L (ref 11–51)

## 2023-11-27 MED ORDER — PANTOPRAZOLE SODIUM 40 MG IV SOLR
40.0000 mg | Freq: Once | INTRAVENOUS | Status: AC
  Administered 2023-11-27: 40 mg via INTRAVENOUS
  Filled 2023-11-27: qty 10

## 2023-11-27 MED ORDER — ONDANSETRON HCL 4 MG/2ML IJ SOLN
4.0000 mg | Freq: Once | INTRAMUSCULAR | Status: AC
  Administered 2023-11-27: 4 mg via INTRAVENOUS
  Filled 2023-11-27: qty 2

## 2023-11-27 MED ORDER — LACTATED RINGERS IV BOLUS
1000.0000 mL | Freq: Once | INTRAVENOUS | Status: AC
  Administered 2023-11-27: 1000 mL via INTRAVENOUS

## 2023-11-27 MED ORDER — POTASSIUM CHLORIDE CRYS ER 20 MEQ PO TBCR: 20.0000 meq | EXTENDED_RELEASE_TABLET | Freq: Every day | ORAL | 0 refills | Status: DC

## 2023-11-27 MED ORDER — POTASSIUM CHLORIDE CRYS ER 20 MEQ PO TBCR
40.0000 meq | EXTENDED_RELEASE_TABLET | Freq: Once | ORAL | Status: AC
  Administered 2023-11-27: 40 meq via ORAL
  Filled 2023-11-27: qty 2

## 2023-11-27 MED ORDER — POTASSIUM CHLORIDE 10 MEQ/100ML IV SOLN
10.0000 meq | INTRAVENOUS | Status: AC
  Administered 2023-11-27 (×3): 10 meq via INTRAVENOUS
  Filled 2023-11-27 (×3): qty 100

## 2023-11-27 MED ORDER — ONDANSETRON 4 MG PO TBDP
4.0000 mg | ORAL_TABLET | Freq: Once | ORAL | Status: AC | PRN
  Administered 2023-11-27: 4 mg via ORAL
  Filled 2023-11-27: qty 1

## 2023-11-27 NOTE — ED Triage Notes (Signed)
Patient presents due to vomiting the last few days. He would like Fluids and an MD noted to go back to work. Patient is actively vomiting in triage. Patient denies diarrhea.

## 2023-11-27 NOTE — ED Notes (Addendum)
Patient was given a sandwich and a cup of juice.

## 2023-11-27 NOTE — ED Provider Notes (Signed)
[place] EMERGENCY DEPARTMENT AT [facility] Provider Note   CSN: [id number] Arrival date & time: 11/27/23  1142     History  Chief Complaint  Patient presents with   Emesis    Jason Hess is a 28 y.o. male.  Patient is a 28 year old male who presents with nausea and vomiting.  He reports nausea and vomiting for the last 3 days.  He denies any significant associated abdominal pain.  No diarrhea.  No known fevers.  He has had similar episodes intermittently over the past few years.  He does not have a primary care doctor and has not followed up with anyone regarding this.  He says that he did smoke marijuana but the last time he smoked has been several months ago.  He occasionally drinks alcohol but his last alcohol ingestion was over the weekend and he states he only drank 1 beer.  His emesis is nonbloody and nonbilious.       Home Medications Prior to Admission medications   Medication Sig Start Date End Date Taking? Authorizing Provider  potassium chloride SA (KLOR-CON M) 20 MEQ tablet Take 1 tablet (20 mEq total) by mouth daily. 11/27/23  Yes Rolan Bucco, MD  Acetaminophen (CHLORASEPTIC SORE THROAT PO) Take 1-2 sprays by mouth daily as needed (sore throat).    [provider]  acetaminophen (TYLENOL) 325 MG tablet Take 650 mg by mouth every 6 (six) hours as needed for mild pain, fever or headache.    [provider]  dicyclomine (BENTYL) 20 MG tablet Take 1 tablet (20 mg total) by mouth 2 (two) times daily. 12/07/21   Cristopher Peru, PA-C  ondansetron (ZOFRAN) 4 MG tablet Take 1 tablet (4 mg total) by mouth every 6 (six) hours. 09/27/22   Curatolo, Adam, DO  ondansetron (ZOFRAN-ODT) 4 MG disintegrating tablet Take 1 tablet (4 mg total) by mouth every 8 (eight) hours as needed for nausea or vomiting. 01/14/23   Gustavus Bryant, FNP  promethazine (PHENERGAN) 25 MG suppository Place 1 suppository (25 mg total) rectally every 6 (six) hours as needed for  nausea or vomiting. 01/16/23   Peter Garter, PA  sucralfate (CARAFATE) 1 g tablet Take 1 tablet (1 g total) by mouth 4 (four) times daily -  with meals and at bedtime for 7 days. 06/11/22 06/18/22  Sloan Leiter, DO      Allergies    Patient has no known allergies.    Review of Systems   Review of Systems  Constitutional:  Negative for chills, diaphoresis, fatigue and fever.  HENT:  Negative for congestion, rhinorrhea and sneezing.   Eyes: Negative.   Respiratory:  Negative for cough, chest tightness and shortness of breath.   Cardiovascular:  Negative for chest pain and leg swelling.  Gastrointestinal:  Positive for nausea and vomiting. Negative for abdominal pain, blood in stool and diarrhea.  Genitourinary:  Negative for difficulty urinating, flank pain, frequency and hematuria.  Musculoskeletal:  Negative for arthralgias and back pain.  Skin:  Negative for rash.  Neurological:  Negative for dizziness, speech difficulty, weakness, numbness and headaches.    Physical Exam Updated Vital Signs BP 136/77   Pulse 82   Temp 98.9 F (37.2 C) (Oral)   Resp 18   SpO2 99%  Physical Exam Constitutional:      Appearance: He is well-developed.  HENT:     Head: Normocephalic and atraumatic.  Eyes:     Pupils: Pupils are equal, round, and  reactive to light.  Cardiovascular:     Rate and Rhythm: Normal rate and regular rhythm.     Heart sounds: Normal heart sounds.  Pulmonary:     Effort: Pulmonary effort is normal. No respiratory distress.     Breath sounds: Normal breath sounds. No wheezing or rales.  Chest:     Chest wall: No tenderness.  Abdominal:     General: Bowel sounds are normal.     Palpations: Abdomen is soft.     Tenderness: There is no abdominal tenderness. There is no guarding or rebound.  Musculoskeletal:        General: Normal range of motion.     Cervical back: Normal range of motion and neck supple.  Lymphadenopathy:     Cervical: No cervical adenopathy.   Skin:    General: Skin is warm and dry.     Findings: No rash.  Neurological:     Mental Status: He is alert and oriented to person, place, and time.     ED Results / Procedures / Treatments   Labs (all labs ordered are listed, but only abnormal results are displayed) Labs Reviewed  COMPREHENSIVE METABOLIC PANEL - Abnormal; Notable for the following components:      Result Value   Sodium 133 (*)    Potassium 2.6 (*)    Chloride 84 (*)    CO2 33 (*)    Glucose, Bld 159 (*)    Total Protein 9.1 (*)    Total Bilirubin 1.3 (*)    Anion gap 16 (*)    All other components within normal limits  CBC - Abnormal; Notable for the following components:   WBC 12.4 (*)    RBC 6.57 (*)    MCV 76.6 (*)    MCH 23.6 (*)    All other components within normal limits  URINALYSIS, ROUTINE W REFLEX MICROSCOPIC - Abnormal; Notable for the following components:   Hgb urine dipstick SMALL (*)    Protein, ur 100 (*)    Bacteria, UA RARE (*)    All other components within normal limits  I-STAT CHEM 8, ED - Abnormal; Notable for the following components:   Sodium 133 (*)    Chloride 87 (*)    Calcium, Ion 1.06 (*)    TCO2 39 (*)    All other components within normal limits  LIPASE, BLOOD  MAGNESIUM    EKG EKG Interpretation Date/Time:  Thursday November 27 2023 14:22:26 EST Ventricular Rate:  64 PR Interval:  181 QRS Duration:  104 QT Interval:  405 QTC Calculation: 418 R Axis:   90  Text Interpretation: Sinus rhythm Borderline right axis deviation Borderline ST elevation, anterior leads similar to prior EKG Confirmed by Rolan Bucco [phone]) on 11/27/2023 3:23:45 PM  Radiology No results found.  Procedures Procedures    Medications Ordered in ED Medications  ondansetron (ZOFRAN-ODT) disintegrating tablet 4 mg (4 mg Oral Given 11/27/23 1215)  lactated ringers bolus 1,000 mL (0 mLs Intravenous Stopped 11/27/23 1817)  ondansetron (ZOFRAN) injection 4 mg (4 mg Intravenous Given  11/27/23 1529)  pantoprazole (PROTONIX) injection 40 mg (40 mg Intravenous Given 11/27/23 1529)  potassium chloride 10 mEq in 100 mL IVPB (0 mEq Intravenous Stopped 11/27/23 1838)  potassium chloride SA (KLOR-CON M) CR tablet 40 mEq (40 mEq Oral Given 11/27/23 1532)    ED Course/ Medical Decision Making/ A&P  Medical Decision Making Amount and/or Complexity of Data Reviewed Labs: ordered.  Risk Prescription drug management.   Patient is a 28 year old who presents with nausea and vomiting.  He has had similar symptoms in the past.  I reviewed his charting's had multiple ED visits for same.  He has had a CT scan in the past with similar symptoms that did not show any acute abnormalities.  He does not have any associated abdominal pain so I do not feel that repeat imaging is indicated.  His labs are reviewed.  There is no evidence of pancreatitis.  His potassium is markedly low.  It has been low in the past as well.  He was given both IV and oral potassium replacement.  His magnesium level is normal.  His potassium is rechecked and has normalized.  He is feeling much better after this treatment and IV fluids.  Was also given antiemetics.  He is able to tolerate oral fluids without any ongoing vomiting or other symptoms.  No abdominal pain.  He was discharged home in good condition.  Was given a prescription for a few days worth of oral potassium.  He says that he has nausea medication at home to use.  He was given a referral to follow-up with gastroenterology.  Return precautions were given.  Final Clinical Impression(s) / ED Diagnoses Final diagnoses:  Nausea and vomiting, unspecified vomiting type  Hypokalemia    Rx / DC Orders ED Discharge Orders          Ordered    potassium chloride SA (KLOR-CON M) 20 MEQ tablet  Daily        11/27/23 2107              Rolan Bucco, MD 11/27/23 2312

## 2023-12-28 ENCOUNTER — Encounter (HOSPITAL_BASED_OUTPATIENT_CLINIC_OR_DEPARTMENT_OTHER): Payer: Self-pay | Admitting: Emergency Medicine

## 2023-12-28 DIAGNOSIS — R7309 Other abnormal glucose: Secondary | ICD-10-CM | POA: Diagnosis not present

## 2023-12-28 DIAGNOSIS — E876 Hypokalemia: Secondary | ICD-10-CM | POA: Diagnosis not present

## 2023-12-28 DIAGNOSIS — N289 Disorder of kidney and ureter, unspecified: Secondary | ICD-10-CM | POA: Diagnosis not present

## 2023-12-28 DIAGNOSIS — R112 Nausea with vomiting, unspecified: Secondary | ICD-10-CM | POA: Diagnosis present

## 2023-12-28 MED ORDER — ONDANSETRON 4 MG PO TBDP
4.0000 mg | ORAL_TABLET | Freq: Once | ORAL | Status: AC | PRN
  Administered 2023-12-28: 4 mg via ORAL
  Filled 2023-12-28: qty 1

## 2023-12-28 NOTE — ED Triage Notes (Signed)
N/V since Friday.

## 2023-12-29 ENCOUNTER — Emergency Department (HOSPITAL_BASED_OUTPATIENT_CLINIC_OR_DEPARTMENT_OTHER)
Admission: EM | Admit: 2023-12-29 | Discharge: 2023-12-29 | Disposition: A | Discharge status: Home / Self Care | Attending: Emergency Medicine | Admitting: Emergency Medicine

## 2023-12-29 DIAGNOSIS — R112 Nausea with vomiting, unspecified: Secondary | ICD-10-CM | POA: Diagnosis not present

## 2023-12-29 DIAGNOSIS — R739 Hyperglycemia, unspecified: Secondary | ICD-10-CM

## 2023-12-29 DIAGNOSIS — E876 Hypokalemia: Secondary | ICD-10-CM

## 2023-12-29 DIAGNOSIS — N289 Disorder of kidney and ureter, unspecified: Secondary | ICD-10-CM

## 2023-12-29 LAB — COMPREHENSIVE METABOLIC PANEL
ALT: 12 U/L (ref 0–44)
AST: 18 U/L (ref 15–41)
Albumin: 5 g/dL (ref 3.5–5.0)
Alkaline Phosphatase: 98 U/L (ref 38–126)
Anion gap: 16 — ABNORMAL HIGH (ref 5–15)
BUN: 13 mg/dL (ref 6–20)
CO2: 35 mmol/L — ABNORMAL HIGH (ref 22–32)
Calcium: 9.8 mg/dL (ref 8.9–10.3)
Chloride: 86 mmol/L — ABNORMAL LOW (ref 98–111)
Creatinine, Ser: 1.4 mg/dL — ABNORMAL HIGH (ref 0.61–1.24)
GFR, Estimated: >60 mL/min (ref >60)
Glucose, Bld: 127 mg/dL — ABNORMAL HIGH (ref 70–99)
Potassium: 2.7 mmol/L — CL (ref 3.5–5.1)
Sodium: 137 mmol/L (ref 135–145)
Total Bilirubin: 1 mg/dL (ref 0.0–1.2)
Total Protein: 9.5 g/dL — ABNORMAL HIGH (ref 6.5–8.1)

## 2023-12-29 LAB — URINALYSIS, ROUTINE W REFLEX MICROSCOPIC
Glucose, UA: NEGATIVE mg/dL
Ketones, ur: 40 mg/dL — AB
Leukocytes,Ua: NEGATIVE
Nitrite: NEGATIVE
Protein, ur: >=300 mg/dL — AB
Specific Gravity, Urine: 1.025 (ref 1.005–1.030)
pH: 6.5 (ref 5.0–8.0)

## 2023-12-29 LAB — CBC WITH DIFFERENTIAL/PLATELET
Abs Immature Granulocytes: 0.03 10*3/uL (ref 0.00–0.07)
Basophils Absolute: 0 10*3/uL (ref 0.0–0.1)
Basophils Relative: 0 %
Eosinophils Absolute: 0 10*3/uL (ref 0.0–0.5)
Eosinophils Relative: 0 %
HCT: 50 % (ref 39.0–52.0)
Hemoglobin: 15.4 g/dL (ref 13.0–17.0)
Immature Granulocytes: 0 %
Lymphocytes Relative: 34 %
Lymphs Abs: 3.6 10*3/uL (ref 0.7–4.0)
MCH: 23.4 pg — ABNORMAL LOW (ref 26.0–34.0)
MCHC: 30.8 g/dL (ref 30.0–36.0)
MCV: 76 fL — ABNORMAL LOW (ref 80.0–100.0)
Monocytes Absolute: 0.8 10*3/uL (ref 0.1–1.0)
Monocytes Relative: 7 %
Neutro Abs: 6.2 10*3/uL (ref 1.7–7.7)
Neutrophils Relative %: 59 %
Platelets: 233 10*3/uL (ref 150–400)
RBC: 6.58 MIL/uL — ABNORMAL HIGH (ref 4.22–5.81)
RDW: 14.8 % (ref 11.5–15.5)
WBC: 10.7 10*3/uL — ABNORMAL HIGH (ref 4.0–10.5)
nRBC: 0 % (ref 0.0–0.2)

## 2023-12-29 LAB — URINALYSIS, MICROSCOPIC (REFLEX): Bacteria, UA: NONE SEEN

## 2023-12-29 LAB — LIPASE, BLOOD: Lipase: 22 U/L (ref 11–51)

## 2023-12-29 LAB — MAGNESIUM: Magnesium: 1.7 mg/dL (ref 1.7–2.4)

## 2023-12-29 MED ORDER — ONDANSETRON HCL 4 MG/2ML IJ SOLN
4.0000 mg | Freq: Once | INTRAMUSCULAR | Status: AC
  Administered 2023-12-29: 4 mg via INTRAVENOUS
  Filled 2023-12-29: qty 2

## 2023-12-29 MED ORDER — POTASSIUM CHLORIDE 10 MEQ/100ML IV SOLN
10.0000 meq | INTRAVENOUS | Status: AC
Start: 2023-12-29 — End: 2023-12-29
  Administered 2023-12-29 (×2): 10 meq via INTRAVENOUS
  Filled 2023-12-29 (×2): qty 100

## 2023-12-29 MED ORDER — SODIUM CHLORIDE 0.9 % IV BOLUS
1000.0000 mL | Freq: Once | INTRAVENOUS | Status: AC
  Administered 2023-12-29: 1000 mL via INTRAVENOUS

## 2023-12-29 MED ORDER — POTASSIUM CHLORIDE CRYS ER 20 MEQ PO TBCR: 20.0000 meq | EXTENDED_RELEASE_TABLET | Freq: Two times a day (BID) | ORAL | 0 refills | Status: DC

## 2023-12-29 MED ORDER — ONDANSETRON 8 MG PO TBDP: 8.0000 mg | ORAL_TABLET | Freq: Three times a day (TID) | ORAL | 0 refills | Status: DC | PRN

## 2023-12-29 MED ORDER — SODIUM CHLORIDE 0.9 % IV SOLN
INTRAVENOUS | Status: DC | PRN
  Administered 2023-12-29: 500 mL via INTRAVENOUS

## 2023-12-29 MED ORDER — POTASSIUM CHLORIDE CRYS ER 20 MEQ PO TBCR
40.0000 meq | EXTENDED_RELEASE_TABLET | Freq: Once | ORAL | Status: AC
  Administered 2023-12-29: 40 meq via ORAL
  Filled 2023-12-29: qty 2

## 2023-12-29 NOTE — ED Provider Notes (Signed)
 [place] EMERGENCY DEPARTMENT AT [facility] Provider Note   CSN: [id number] Arrival date & time: 12/28/23  2344     History  Chief Complaint  Patient presents with   Emesis    Jason Hess is a 28 y.o. male.  The history is provided by the patient.  Emesis He has no significant past history and comes in because of nausea and vomiting for the last 3 days.  He has not been able to hold anything down.  He denies any diarrhea.  He denies fever, chills, sweats.  He admits to some slight abdominal soreness from vomiting but denies true abdominal pain.  Of note, he states that he has had several other ED visits with similar presentations.  He has never had any workup outside of the emergency department.   Home Medications Prior to Admission medications   Medication Sig Start Date End Date Taking? Authorizing Provider  ondansetron (ZOFRAN-ODT) 8 MG disintegrating tablet Take 1 tablet (8 mg total) by mouth every 8 (eight) hours as needed for nausea or vomiting. 12/29/23  Yes Dione Booze, MD  Acetaminophen (CHLORASEPTIC SORE THROAT PO) Take 1-2 sprays by mouth daily as needed (sore throat).    [provider]  acetaminophen (TYLENOL) 325 MG tablet Take 650 mg by mouth every 6 (six) hours as needed for mild pain, fever or headache.    [provider]  dicyclomine (BENTYL) 20 MG tablet Take 1 tablet (20 mg total) by mouth 2 (two) times daily. 12/07/21   Cristopher Peru, PA-C  ondansetron (ZOFRAN) 4 MG tablet Take 1 tablet (4 mg total) by mouth every 6 (six) hours. 09/27/22   Curatolo, Adam, DO  potassium chloride SA (KLOR-CON M) 20 MEQ tablet Take 1 tablet (20 mEq total) by mouth 2 (two) times daily. 12/29/23   Dione Booze, MD  promethazine (PHENERGAN) 25 MG suppository Place 1 suppository (25 mg total) rectally every 6 (six) hours as needed for nausea or vomiting. 01/16/23   Peter Garter, PA  sucralfate (CARAFATE) 1 g tablet Take 1 tablet (1 g total) by mouth 4  (four) times daily -  with meals and at bedtime for 7 days. 06/11/22 06/18/22  Sloan Leiter, DO      Allergies    Patient has no known allergies.    Review of Systems   Review of Systems  Gastrointestinal:  Positive for vomiting.  All other systems reviewed and are negative.   Physical Exam Updated Vital Signs BP (!) 137/101   Pulse 80   Temp 98 F (36.7 C) (Oral)   Resp 14   Ht 5\' 10"  (1.778 m)   Wt 98 kg   SpO2 97%   BMI 30.99 kg/m  Physical Exam Vitals and nursing note reviewed.   28 year old male, resting comfortably and in no acute distress. Vital signs are significant for elevated blood pressure. Oxygen saturation is 99%, which is normal. Head is normocephalic and atraumatic. PERRLA, EOMI. Oropharynx is clear. Neck is nontender and supple without adenopathy or JVD. Back is nontender and there is no CVA tenderness. Lungs are clear without rales, wheezes, or rhonchi. Chest is nontender. Heart has regular rate and rhythm without murmur. Abdomen is soft, flat, nontender. Extremities have no cyanosis or edema, full range of motion is present. Skin is warm and dry without rash. Neurologic: Mental status is normal, cranial nerves are intact, moves all extremities equally.  ED Results / Procedures / Treatments   Labs (all labs  ordered are listed, but only abnormal results are displayed) Labs Reviewed  COMPREHENSIVE METABOLIC PANEL - Abnormal; Notable for the following components:      Result Value   Potassium 2.7 (*)    Chloride 86 (*)    CO2 35 (*)    Glucose, Bld 127 (*)    Creatinine, Ser 1.40 (*)    Total Protein 9.5 (*)    Anion gap 16 (*)    All other components within normal limits  URINALYSIS, ROUTINE W REFLEX MICROSCOPIC - Abnormal; Notable for the following components:   Hgb urine dipstick TRACE (*)    Bilirubin Urine SMALL (*)    Ketones, ur 40 (*)    Protein, ur >=300 (*)    All other components within normal limits  CBC WITH DIFFERENTIAL/PLATELET -  Abnormal; Notable for the following components:   WBC 10.7 (*)    RBC 6.58 (*)    MCV 76.0 (*)    MCH 23.4 (*)    All other components within normal limits  LIPASE, BLOOD  URINALYSIS, MICROSCOPIC (REFLEX)  MAGNESIUM    EKG EKG Interpretation Date/Time:  Monday December 29 2023 01:02:46 EDT Ventricular Rate:  68 PR Interval:  176 QRS Duration:  107 QT Interval:  388 QTC Calculation: 413 R Axis:   91  Text Interpretation: Sinus rhythm Borderline right axis deviation When compared with ECG of 11/27/2023, No significant change was found Confirmed by Dione Booze ([id number]) on 12/29/2023 1:10:11 AM  Radiology No results found.  Procedures Procedures  Cardiac monitor shows normal sinus rhythm, per my interpretation.  Medications Ordered in ED Medications  0.9 %  sodium chloride infusion (500 mLs Intravenous New Bag/Given 12/29/23 0229)  ondansetron (ZOFRAN-ODT) disintegrating tablet 4 mg (4 mg Oral Given 12/28/23 2352)  sodium chloride 0.9 % bolus 1,000 mL (0 mLs Intravenous Stopped 12/29/23 0226)  potassium chloride 10 mEq in 100 mL IVPB (10 mEq Intravenous New Bag/Given 12/29/23 0309)  potassium chloride SA (KLOR-CON M) CR tablet 40 mEq (40 mEq Oral Given 12/29/23 0201)  ondansetron (ZOFRAN) injection 4 mg (4 mg Intravenous Given 12/29/23 0158)    ED Course/ Medical Decision Making/ A&P                                 Medical Decision Making Amount and/or Complexity of Data Reviewed Labs: ordered.  Risk Prescription drug management.   Nausea and vomiting which apparently is recurrent.  Current symptoms would be consistent with viral gastritis, doubt bowel obstruction.  I have reviewed his past records, and he has 10 ED visits for vomiting.  This would be suggestive of cyclic vomiting syndrome, consider gastroparesis.  He had CT of abdomen and pelvis on 10/03/2020 which was normal.  I have reviewed his laboratory tests, and my interpretation is moderate to severe hypokalemia which is  likely secondary to gastrointestinal losses, mild elevation of creatinine likely from dehydration, metabolic alkalosis likely from volume contraction, normal magnesium level, mild leukocytosis which is nonspecific, elevated random glucose level which has been present previously and will need to be followed as an outpatient.  Urinalysis is significant for ketones and protein, and I note proteinuria being present on all urine samples dating back to 2019.  He was given a dose of ondansetron oral dissolving tablet at triage and vomited immediately after getting that.  I have ordered additional intravenous ondansetron and I have ordered IV fluids as well as oral and  IV potassium.  He is feeling somewhat better following above-noted treatment.  He has tolerated oral fluid challenge.  I am discharging him with prescription for ondansetron oral dissolving tablet and I am referring him to gastroenterology for outpatient workup.  CRITICAL CARE Performed by: Dione Booze Total critical care time: 35 minutes Critical care time was exclusive of separately billable procedures and treating other patients. Critical care was necessary to treat or prevent imminent or life-threatening deterioration. Critical care was time spent personally by me on the following activities: development of treatment plan with patient and/or surrogate as well as nursing, discussions with consultants, evaluation of patient's response to treatment, examination of patient, obtaining history from patient or surrogate, ordering and performing treatments and interventions, ordering and review of laboratory studies, ordering and review of radiographic studies, pulse oximetry and re-evaluation of patient's condition.  Final Clinical Impression(s) / ED Diagnoses Final diagnoses:  Nausea and vomiting, unspecified vomiting type  Hypokalemia due to excessive gastrointestinal loss of potassium  Elevated random blood glucose level  Renal insufficiency     Rx / DC Orders ED Discharge Orders          Ordered    ondansetron (ZOFRAN-ODT) 8 MG disintegrating tablet  Every 8 hours PRN        12/29/23 0439    potassium chloride SA (KLOR-CON M) 20 MEQ tablet  2 times daily        12/29/23 0439              Dione Booze, MD 12/29/23 [phone]

## 2023-12-29 NOTE — Discharge Instructions (Signed)
 I suspect that you have a condition called cyclic vomiting syndrome.  Please make an appointment with the gastroenterologist for further outpatient evaluation.  Return to the emergency department if symptoms or not being adequately controlled at home.

## 2024-03-12 ENCOUNTER — Other Ambulatory Visit: Payer: Self-pay

## 2024-03-12 ENCOUNTER — Encounter (HOSPITAL_BASED_OUTPATIENT_CLINIC_OR_DEPARTMENT_OTHER): Payer: Self-pay

## 2024-03-12 ENCOUNTER — Emergency Department (HOSPITAL_BASED_OUTPATIENT_CLINIC_OR_DEPARTMENT_OTHER)
Admission: EM | Admit: 2024-03-12 | Discharge: 2024-03-12 | Disposition: A | Discharge status: Home / Self Care | Attending: Emergency Medicine | Admitting: Emergency Medicine

## 2024-03-12 DIAGNOSIS — R112 Nausea with vomiting, unspecified: Secondary | ICD-10-CM | POA: Insufficient documentation

## 2024-03-12 DIAGNOSIS — F12188 Cannabis abuse with other cannabis-induced disorder: Secondary | ICD-10-CM | POA: Insufficient documentation

## 2024-03-12 DIAGNOSIS — F1290 Cannabis use, unspecified, uncomplicated: Secondary | ICD-10-CM

## 2024-03-12 DIAGNOSIS — R1110 Vomiting, unspecified: Secondary | ICD-10-CM | POA: Diagnosis present

## 2024-03-12 LAB — URINALYSIS, ROUTINE W REFLEX MICROSCOPIC
Glucose, UA: NEGATIVE mg/dL
Hgb urine dipstick: NEGATIVE
Ketones, ur: 15 mg/dL — AB
Leukocytes,Ua: NEGATIVE
Nitrite: NEGATIVE
Protein, ur: 100 mg/dL — AB
Specific Gravity, Urine: >=1.03 (ref 1.005–1.030)
pH: 6 (ref 5.0–8.0)

## 2024-03-12 LAB — LIPASE, BLOOD: Lipase: 23 U/L (ref 11–51)

## 2024-03-12 LAB — URINALYSIS, MICROSCOPIC (REFLEX)

## 2024-03-12 LAB — URINE DRUG SCREEN
Amphetamines: NOT DETECTED
Barbiturates: NOT DETECTED
Benzodiazepines: NOT DETECTED
Cocaine: DETECTED — AB
Fentanyl: NOT DETECTED
Methadone Scn, Ur: NOT DETECTED
Opiates: NOT DETECTED
Tetrahydrocannabinol: DETECTED — AB

## 2024-03-12 LAB — CBC WITH DIFFERENTIAL/PLATELET
Abs Immature Granulocytes: 0.03 10*3/uL (ref 0.00–0.07)
Basophils Absolute: 0 10*3/uL (ref 0.0–0.1)
Basophils Relative: 0 %
Eosinophils Absolute: 0 10*3/uL (ref 0.0–0.5)
Eosinophils Relative: 1 %
HCT: 46.5 % (ref 39.0–52.0)
Hemoglobin: 14.3 g/dL (ref 13.0–17.0)
Immature Granulocytes: 0 %
Lymphocytes Relative: 40 %
Lymphs Abs: 3.2 10*3/uL (ref 0.7–4.0)
MCH: 23.5 pg — ABNORMAL LOW (ref 26.0–34.0)
MCHC: 30.8 g/dL (ref 30.0–36.0)
MCV: 76.5 fL — ABNORMAL LOW (ref 80.0–100.0)
Monocytes Absolute: 0.5 10*3/uL (ref 0.1–1.0)
Monocytes Relative: 7 %
Neutro Abs: 4.3 10*3/uL (ref 1.7–7.7)
Neutrophils Relative %: 52 %
Platelets: 208 10*3/uL (ref 150–400)
RBC: 6.08 MIL/uL — ABNORMAL HIGH (ref 4.22–5.81)
RDW: 14.5 % (ref 11.5–15.5)
WBC: 8.2 10*3/uL (ref 4.0–10.5)
nRBC: 0 % (ref 0.0–0.2)

## 2024-03-12 LAB — COMPREHENSIVE METABOLIC PANEL WITH GFR
ALT: 13 U/L (ref 0–44)
AST: 23 U/L (ref 15–41)
Albumin: 5.1 g/dL — ABNORMAL HIGH (ref 3.5–5.0)
Alkaline Phosphatase: 136 U/L — ABNORMAL HIGH (ref 38–126)
Anion gap: 15 (ref 5–15)
BUN: 14 mg/dL (ref 6–20)
CO2: 29 mmol/L (ref 22–32)
Calcium: 9.7 mg/dL (ref 8.9–10.3)
Chloride: 96 mmol/L — ABNORMAL LOW (ref 98–111)
Creatinine, Ser: 1.09 mg/dL (ref 0.61–1.24)
GFR, Estimated: >60 mL/min (ref >60)
Glucose, Bld: 116 mg/dL — ABNORMAL HIGH (ref 70–99)
Potassium: 3.7 mmol/L (ref 3.5–5.1)
Sodium: 140 mmol/L (ref 135–145)
Total Bilirubin: 0.6 mg/dL (ref 0.0–1.2)
Total Protein: 8.8 g/dL — ABNORMAL HIGH (ref 6.5–8.1)

## 2024-03-12 MED ORDER — METOCLOPRAMIDE HCL 5 MG/ML IJ SOLN
10.0000 mg | Freq: Once | INTRAMUSCULAR | Status: AC
  Administered 2024-03-12: 10 mg via INTRAVENOUS
  Filled 2024-03-12: qty 2

## 2024-03-12 MED ORDER — SODIUM CHLORIDE 0.9 % IV BOLUS
1000.0000 mL | Freq: Once | INTRAVENOUS | Status: AC
  Administered 2024-03-12: 1000 mL via INTRAVENOUS

## 2024-03-12 MED ORDER — METOCLOPRAMIDE HCL 10 MG PO TABS: 10.0000 mg | ORAL_TABLET | Freq: Four times a day (QID) | ORAL | 0 refills | Status: DC

## 2024-03-12 NOTE — Discharge Instructions (Signed)
 A prescription for Reglan  has been sent to your pharmacy. Use this to control vomiting. Avoid marijuana as this is likely contributing to your recurrent/frequent nausea and vomiting episodes.   Follow up with your doctor as needed for further evaluation if symptoms are persistent.

## 2024-03-12 NOTE — ED Notes (Signed)

## 2024-03-12 NOTE — ED Triage Notes (Signed)
 Pt reports that he was having some vomiting since yesterday. States that his stomach hurts a little bit. States that he last vomited yesterday. States that he drank fluids today and they have stayed down so far. Denies fever.

## 2024-03-12 NOTE — ED Provider Notes (Signed)
 [place] EMERGENCY DEPARTMENT AT [facility] Provider Note   CSN: [id number] Arrival date & time: 03/12/24  1149     History  Chief Complaint  Patient presents with   Vomiting    Jason Hess is a 28 y.o. male.  Patient to ED with nausea and vomiting x 3 days. History of multiple, frequent ED visits for same. No fever, diarrhea or significant pain. He denies regular marijuana use but has tested positive on past visits. No hematemesis.   The history is provided by the patient. No language interpreter was used.       Home Medications Prior to Admission medications   Medication Sig Start Date End Date Taking? Authorizing Provider  metoCLOPramide  (REGLAN ) 10 MG tablet Take 1 tablet (10 mg total) by mouth every 6 (six) hours. 03/12/24  Yes Melquiades Kovar, Clovis Dar, PA-C  Acetaminophen  (CHLORASEPTIC SORE THROAT PO) Take 1-2 sprays by mouth daily as needed (sore throat).    [provider]  acetaminophen  (TYLENOL ) 325 MG tablet Take 650 mg by mouth every 6 (six) hours as needed for mild pain, fever or headache.    [provider]  dicyclomine  (BENTYL ) 20 MG tablet Take 1 tablet (20 mg total) by mouth 2 (two) times daily. 12/07/21   Autry, Lauren E, PA-C  ondansetron  (ZOFRAN ) 4 MG tablet Take 1 tablet (4 mg total) by mouth every 6 (six) hours. 09/27/22   Curatolo, Adam, DO  ondansetron  (ZOFRAN -ODT) 8 MG disintegrating tablet Take 1 tablet (8 mg total) by mouth every 8 (eight) hours as needed for nausea or vomiting. 12/29/23   Alissa April, MD  potassium chloride  SA (KLOR-CON  M) 20 MEQ tablet Take 1 tablet (20 mEq total) by mouth 2 (two) times daily. 12/29/23   Alissa April, MD  promethazine  (PHENERGAN ) 25 MG suppository Place 1 suppository (25 mg total) rectally every 6 (six) hours as needed for nausea or vomiting. 01/16/23   [place] Butter, PA  sucralfate  (CARAFATE ) 1 g tablet Take 1 tablet (1 g total) by mouth 4 (four) times daily -  with meals and at bedtime for 7 days.  06/11/22 06/18/22  Teddi Favors, DO      Allergies    Patient has no known allergies.    Review of Systems   Review of Systems  Physical Exam Updated Vital Signs BP (!) 154/93 (BP Location: Right Arm)   Pulse (!) 58   Temp 97.7 F (36.5 C)   Resp 18   Ht 5\' 10"  (1.778 m)   Wt 93.6 kg   SpO2 100%   BMI 29.60 kg/m  Physical Exam Vitals and nursing note reviewed.  Constitutional:      Appearance: He is well-developed.  HENT:     Head: Normocephalic.     Mouth/Throat:     Mouth: Mucous membranes are dry.  Cardiovascular:     Rate and Rhythm: Normal rate.  Pulmonary:     Effort: Pulmonary effort is normal.  Abdominal:     Palpations: Abdomen is soft.     Tenderness: There is no abdominal tenderness. There is no guarding or rebound.  Musculoskeletal:        General: Normal range of motion.     Cervical back: Normal range of motion and neck supple.  Skin:    General: Skin is warm and dry.  Neurological:     General: No focal deficit present.     Mental Status: He is alert and oriented to person, place, and time.  ED Results / Procedures / Treatments   Labs (all labs ordered are listed, but only abnormal results are displayed) Labs Reviewed  CBC WITH DIFFERENTIAL/PLATELET - Abnormal; Notable for the following components:      Result Value   RBC 6.08 (*)    MCV 76.5 (*)    MCH 23.5 (*)    All other components within normal limits  COMPREHENSIVE METABOLIC PANEL WITH GFR - Abnormal; Notable for the following components:   Chloride 96 (*)    Glucose, Bld 116 (*)    Total Protein 8.8 (*)    Albumin 5.1 (*)    Alkaline Phosphatase 136 (*)    All other components within normal limits  URINALYSIS, ROUTINE W REFLEX MICROSCOPIC - Abnormal; Notable for the following components:   Color, Urine AMBER (*)    APPearance HAZY (*)    Bilirubin Urine SMALL (*)    Ketones, ur 15 (*)    Protein, ur 100 (*)    All other components within normal limits  URINE DRUG SCREEN -  Abnormal; Notable for the following components:   Cocaine DETECTED (*)    Tetrahydrocannabinol DETECTED (*)    All other components within normal limits  URINALYSIS, MICROSCOPIC (REFLEX) - Abnormal; Notable for the following components:   Bacteria, UA FEW (*)    All other components within normal limits  LIPASE, BLOOD    EKG None  Radiology No results found.  Procedures Procedures    Medications Ordered in ED Medications  sodium chloride  0.9 % bolus 1,000 mL (0 mLs Intravenous Stopped 03/12/24 1333)  metoCLOPramide  (REGLAN ) injection 10 mg (10 mg Intravenous Given 03/12/24 1232)  sodium chloride  0.9 % bolus 1,000 mL (0 mLs Intravenous Stopped 03/12/24 1508)    ED Course/ Medical Decision Making/ A&P Clinical Course as of 03/14/24 1739  Fri Mar 12, 2024  1326 Patient returns to ED with nausea and vomiting, recurrent visits for same.   Labs c/w mild dehydration, no renal dysfunction, positive for marijuana and cocaine. Likely symptoms related to cannabinoid hyperemesis . IV fluids provided, Reglan . Will continue to monitor. Will provide PO challenge.  [SU]  1536 Patient tolerating PO challenge prior to discharge. Discussed +THC on drug screen and how this is likely related to his recurrent symptoms.  [SU]    Clinical Course User Index [SU] Mandy Second, PA-C                                 Medical Decision Making Amount and/or Complexity of Data Reviewed Labs: ordered.  Risk Prescription drug management.           Final Clinical Impression(s) / ED Diagnoses Final diagnoses:  Cannabinoid hyperemesis syndrome    Rx / DC Orders ED Discharge Orders          Ordered    metoCLOPramide  (REGLAN ) 10 MG tablet  Every 6 hours        03/12/24 1513              Mandy Second, PA-C 03/14/24 1739    Sallyanne Creamer, DO 03/16/24 2030

## 2024-03-12 NOTE — ED Notes (Signed)
 Pt actively vomiting during medication admin.

## 2024-05-26 ENCOUNTER — Other Ambulatory Visit: Payer: Self-pay

## 2024-05-26 ENCOUNTER — Emergency Department (HOSPITAL_BASED_OUTPATIENT_CLINIC_OR_DEPARTMENT_OTHER)

## 2024-05-26 ENCOUNTER — Encounter (HOSPITAL_BASED_OUTPATIENT_CLINIC_OR_DEPARTMENT_OTHER): Payer: Self-pay

## 2024-05-26 DIAGNOSIS — E876 Hypokalemia: Secondary | ICD-10-CM | POA: Diagnosis present

## 2024-05-26 DIAGNOSIS — F1210 Cannabis abuse, uncomplicated: Secondary | ICD-10-CM | POA: Diagnosis present

## 2024-05-26 DIAGNOSIS — E663 Overweight: Secondary | ICD-10-CM | POA: Diagnosis present

## 2024-05-26 DIAGNOSIS — J982 Interstitial emphysema: Secondary | ICD-10-CM | POA: Diagnosis present

## 2024-05-26 DIAGNOSIS — R066 Hiccough: Secondary | ICD-10-CM | POA: Diagnosis present

## 2024-05-26 DIAGNOSIS — K223 Perforation of esophagus: Principal | ICD-10-CM | POA: Diagnosis present

## 2024-05-26 DIAGNOSIS — Z6828 Body mass index (BMI) 28.0-28.9, adult: Secondary | ICD-10-CM

## 2024-05-26 DIAGNOSIS — R112 Nausea with vomiting, unspecified: Secondary | ICD-10-CM | POA: Diagnosis present

## 2024-05-26 NOTE — ED Triage Notes (Addendum)
 Pt states he been vomiting several times a day x 2 days.   States his throat is sore from vomiting and also c/o neck pain.  States he is unable to eat or drink.  During triage now c/o Chest tightness

## 2024-05-27 ENCOUNTER — Inpatient Hospital Stay (HOSPITAL_BASED_OUTPATIENT_CLINIC_OR_DEPARTMENT_OTHER)
Admission: EM | Admit: 2024-05-27 | Discharge: 2024-05-31 | DRG: 370 | Disposition: A | Discharge status: Home / Self Care | Attending: Emergency Medicine | Admitting: Emergency Medicine

## 2024-05-27 ENCOUNTER — Emergency Department (HOSPITAL_BASED_OUTPATIENT_CLINIC_OR_DEPARTMENT_OTHER)

## 2024-05-27 ENCOUNTER — Inpatient Hospital Stay (HOSPITAL_COMMUNITY)

## 2024-05-27 DIAGNOSIS — K223 Perforation of esophagus: Secondary | ICD-10-CM | POA: Diagnosis present

## 2024-05-27 DIAGNOSIS — E876 Hypokalemia: Secondary | ICD-10-CM | POA: Diagnosis present

## 2024-05-27 DIAGNOSIS — J982 Interstitial emphysema: Secondary | ICD-10-CM | POA: Diagnosis present

## 2024-05-27 DIAGNOSIS — F12988 Cannabis use, unspecified with other cannabis-induced disorder: Secondary | ICD-10-CM | POA: Diagnosis not present

## 2024-05-27 DIAGNOSIS — R066 Hiccough: Secondary | ICD-10-CM | POA: Diagnosis present

## 2024-05-27 DIAGNOSIS — F12188 Cannabis abuse with other cannabis-induced disorder: Secondary | ICD-10-CM

## 2024-05-27 DIAGNOSIS — Z6828 Body mass index (BMI) 28.0-28.9, adult: Secondary | ICD-10-CM | POA: Diagnosis not present

## 2024-05-27 DIAGNOSIS — E663 Overweight: Secondary | ICD-10-CM

## 2024-05-27 DIAGNOSIS — R112 Nausea with vomiting, unspecified: Secondary | ICD-10-CM | POA: Diagnosis present

## 2024-05-27 DIAGNOSIS — F1210 Cannabis abuse, uncomplicated: Secondary | ICD-10-CM | POA: Diagnosis present

## 2024-05-27 LAB — CBC
HCT: 37.9 % — ABNORMAL LOW (ref 39.0–52.0)
Hemoglobin: 11.7 g/dL — ABNORMAL LOW (ref 13.0–17.0)
MCH: 23.6 pg — ABNORMAL LOW (ref 26.0–34.0)
MCHC: 30.9 g/dL (ref 30.0–36.0)
MCV: 76.4 fL — ABNORMAL LOW (ref 80.0–100.0)
Platelets: 167 K/uL (ref 150–400)
RBC: 4.96 MIL/uL (ref 4.22–5.81)
RDW: 13.8 % (ref 11.5–15.5)
WBC: 11.3 K/uL — ABNORMAL HIGH (ref 4.0–10.5)
nRBC: 0 % (ref 0.0–0.2)

## 2024-05-27 LAB — COMPREHENSIVE METABOLIC PANEL WITH GFR
ALT: 11 U/L (ref 0–44)
AST: 12 U/L — ABNORMAL LOW (ref 15–41)
Albumin: 3.6 g/dL (ref 3.5–5.0)
Alkaline Phosphatase: 88 U/L (ref 38–126)
Anion gap: 13 (ref 5–15)
BUN: 13 mg/dL (ref 6–20)
CO2: 27 mmol/L (ref 22–32)
Calcium: 8.9 mg/dL (ref 8.9–10.3)
Chloride: 100 mmol/L (ref 98–111)
Creatinine, Ser: 1.04 mg/dL (ref 0.61–1.24)
GFR, Estimated: >60 mL/min (ref >60)
Glucose, Bld: 105 mg/dL — ABNORMAL HIGH (ref 70–99)
Potassium: 3.4 mmol/L — ABNORMAL LOW (ref 3.5–5.1)
Sodium: 140 mmol/L (ref 135–145)
Total Bilirubin: 0.8 mg/dL (ref 0.0–1.2)
Total Protein: 6.9 g/dL (ref 6.5–8.1)

## 2024-05-27 LAB — URINE DRUG SCREEN
Amphetamines: NOT DETECTED
Barbiturates: NOT DETECTED
Benzodiazepines: NOT DETECTED
Cocaine: NOT DETECTED
Fentanyl: NOT DETECTED
Methadone Scn, Ur: NOT DETECTED
Opiates: NOT DETECTED
Tetrahydrocannabinol: DETECTED — AB

## 2024-05-27 LAB — BASIC METABOLIC PANEL WITH GFR
Anion gap: 18 — ABNORMAL HIGH (ref 5–15)
BUN: 17 mg/dL (ref 6–20)
CO2: 28 mmol/L (ref 22–32)
Calcium: 10.2 mg/dL (ref 8.9–10.3)
Chloride: 91 mmol/L — ABNORMAL LOW (ref 98–111)
Creatinine, Ser: 1.1 mg/dL (ref 0.61–1.24)
GFR, Estimated: >60 mL/min (ref >60)
Glucose, Bld: 131 mg/dL — ABNORMAL HIGH (ref 70–99)
Potassium: 3.5 mmol/L (ref 3.5–5.1)
Sodium: 137 mmol/L (ref 135–145)

## 2024-05-27 LAB — CBC WITH DIFFERENTIAL/PLATELET
Abs Immature Granulocytes: 0.03 K/uL (ref 0.00–0.07)
Basophils Absolute: 0 K/uL (ref 0.0–0.1)
Basophils Relative: 0 %
Eosinophils Absolute: 0 K/uL (ref 0.0–0.5)
Eosinophils Relative: 0 %
HCT: 46 % (ref 39.0–52.0)
Hemoglobin: 14 g/dL (ref 13.0–17.0)
Immature Granulocytes: 0 %
Lymphocytes Relative: 13 %
Lymphs Abs: 1.3 K/uL (ref 0.7–4.0)
MCH: 23.1 pg — ABNORMAL LOW (ref 26.0–34.0)
MCHC: 30.4 g/dL (ref 30.0–36.0)
MCV: 76 fL — ABNORMAL LOW (ref 80.0–100.0)
Monocytes Absolute: 0.3 K/uL (ref 0.1–1.0)
Monocytes Relative: 3 %
Neutro Abs: 8.7 K/uL — ABNORMAL HIGH (ref 1.7–7.7)
Neutrophils Relative %: 84 %
Platelets: 229 K/uL (ref 150–400)
RBC: 6.05 MIL/uL — ABNORMAL HIGH (ref 4.22–5.81)
RDW: 13.9 % (ref 11.5–15.5)
WBC: 10.4 K/uL (ref 4.0–10.5)
nRBC: 0 % (ref 0.0–0.2)

## 2024-05-27 LAB — TROPONIN T, HIGH SENSITIVITY
Troponin T High Sensitivity: <15 ng/L (ref 0–19)
Troponin T High Sensitivity: <15 ng/L (ref 0–19)

## 2024-05-27 LAB — TYPE AND SCREEN
ABO/RH(D): B POS
Antibody Screen: NEGATIVE

## 2024-05-27 LAB — MRSA NEXT GEN BY PCR, NASAL: MRSA by PCR Next Gen: NOT DETECTED

## 2024-05-27 LAB — PROTIME-INR
INR: 1 (ref 0.8–1.2)
Prothrombin Time: 14.1 s (ref 11.4–15.2)

## 2024-05-27 LAB — HIV ANTIBODY (ROUTINE TESTING W REFLEX): HIV Screen 4th Generation wRfx: NONREACTIVE

## 2024-05-27 MED ORDER — PIPERACILLIN-TAZOBACTAM 3.375 G IVPB
3.3750 g | Freq: Three times a day (TID) | INTRAVENOUS | Status: DC
  Administered 2024-05-27 – 2024-05-31 (×14): 3.375 g via INTRAVENOUS
  Filled 2024-05-27 (×14): qty 50

## 2024-05-27 MED ORDER — CHLORHEXIDINE GLUCONATE CLOTH 2 % EX PADS
6.0000 | MEDICATED_PAD | Freq: Every day | CUTANEOUS | Status: DC
  Administered 2024-05-27 – 2024-05-31 (×3): 6 via TOPICAL

## 2024-05-27 MED ORDER — ORAL CARE MOUTH RINSE: 15.0000 mL | OROMUCOSAL | Status: DC | PRN

## 2024-05-27 MED ORDER — POLYETHYLENE GLYCOL 3350 17 G PO PACK: 17.0000 g | PACK | Freq: Every day | ORAL | Status: DC | PRN

## 2024-05-27 MED ORDER — ENOXAPARIN SODIUM 40 MG/0.4ML IJ SOSY
40.0000 mg | PREFILLED_SYRINGE | INTRAMUSCULAR | Status: DC
  Administered 2024-05-27 – 2024-05-31 (×5): 40 mg via SUBCUTANEOUS
  Filled 2024-05-27 (×5): qty 0.4

## 2024-05-27 MED ORDER — VANCOMYCIN HCL IN DEXTROSE 1-5 GM/200ML-% IV SOLN
1000.0000 mg | Freq: Once | INTRAVENOUS | Status: AC
  Administered 2024-05-27: 1000 mg via INTRAVENOUS
  Filled 2024-05-27: qty 200

## 2024-05-27 MED ORDER — VANCOMYCIN HCL IN DEXTROSE 1-5 GM/200ML-% IV SOLN: 1000.0000 mg | Freq: Once | INTRAVENOUS | Status: DC

## 2024-05-27 MED ORDER — PANTOPRAZOLE SODIUM 40 MG IV SOLR
40.0000 mg | Freq: Two times a day (BID) | INTRAVENOUS | Status: DC
  Administered 2024-05-27 – 2024-05-31 (×9): 40 mg via INTRAVENOUS
  Filled 2024-05-27 (×9): qty 10

## 2024-05-27 MED ORDER — FLUCONAZOLE IN SODIUM CHLORIDE 400-0.9 MG/200ML-% IV SOLN
400.0000 mg | INTRAVENOUS | Status: DC
  Administered 2024-05-27 – 2024-05-31 (×5): 400 mg via INTRAVENOUS
  Filled 2024-05-27 (×6): qty 200

## 2024-05-27 MED ORDER — MORPHINE SULFATE (PF) 2 MG/ML IV SOLN
2.0000 mg | INTRAVENOUS | Status: DC | PRN
  Administered 2024-05-27: 2 mg via INTRAVENOUS
  Filled 2024-05-27 (×2): qty 1

## 2024-05-27 MED ORDER — PANTOPRAZOLE SODIUM 40 MG IV SOLR: 40.0000 mg | INTRAVENOUS | Status: AC

## 2024-05-27 MED ORDER — DOCUSATE SODIUM 100 MG PO CAPS: 100.0000 mg | ORAL_CAPSULE | Freq: Two times a day (BID) | ORAL | Status: DC | PRN

## 2024-05-27 MED ORDER — ONDANSETRON HCL 4 MG/2ML IJ SOLN: 4.0000 mg | Freq: Once | INTRAMUSCULAR | Status: AC

## 2024-05-27 MED ORDER — ONDANSETRON HCL 4 MG/2ML IJ SOLN
4.0000 mg | Freq: Once | INTRAMUSCULAR | Status: AC
  Administered 2024-05-27: 4 mg via INTRAVENOUS
  Filled 2024-05-27: qty 2

## 2024-05-27 MED ORDER — DIATRIZOATE MEGLUMINE & SODIUM 66-10 % PO SOLN
30.0000 mL | Freq: Once | ORAL | Status: AC
  Administered 2024-05-27: 30 mL via ORAL
  Filled 2024-05-27: qty 30

## 2024-05-27 MED ORDER — PANTOPRAZOLE SODIUM 40 MG IV SOLR
80.0000 mg | Freq: Once | INTRAVENOUS | Status: AC
  Administered 2024-05-27: 80 mg via INTRAVENOUS
  Filled 2024-05-27: qty 20

## 2024-05-27 MED ORDER — SODIUM CHLORIDE 0.9 % IV BOLUS
2000.0000 mL | Freq: Once | INTRAVENOUS | Status: AC
  Administered 2024-05-27: 2000 mL via INTRAVENOUS

## 2024-05-27 MED ORDER — POTASSIUM CHLORIDE 10 MEQ/100ML IV SOLN
10.0000 meq | INTRAVENOUS | Status: AC
  Administered 2024-05-27 (×2): 10 meq via INTRAVENOUS
  Filled 2024-05-27 (×2): qty 100

## 2024-05-27 MED ORDER — ONDANSETRON HCL 4 MG/2ML IJ SOLN
INTRAMUSCULAR | Status: AC
  Administered 2024-05-27: 4 mg via INTRAVENOUS
  Filled 2024-05-27: qty 2

## 2024-05-27 MED ORDER — ONDANSETRON HCL 4 MG/2ML IJ SOLN: 4.0000 mg | Freq: Four times a day (QID) | INTRAMUSCULAR | Status: DC | PRN

## 2024-05-27 MED ORDER — LACTATED RINGERS IV SOLN: INTRAVENOUS | Status: DC

## 2024-05-27 NOTE — Progress Notes (Addendum)
 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 On abx and antifungals.  TCTS recs reviewed - managing conservatively for now and they are following.  No current needs. AM labs added on.  OK to transfer out of ICU to floor per TCTS. Will ask [facility] to assume care in AM 8/15 with PCCM off.   No charge.   Sammi Gore, PA - [facility] For pager details, please see AMION or use Epic chat  After 1900, please call [facility] for cross coverage needs 05/27/2024, 7:29 AM

## 2024-05-27 NOTE — Plan of Care (Signed)

## 2024-05-27 NOTE — Progress Notes (Signed)
 eLink Physician-Brief Progress Note Patient Name: [place] Bussie DOB: 02-Oct-1996 MRN: [id number]   Date of Service  05/27/2024  HPI/Events of Note  62M arrived from [facility]. P/W emesis x 2 days concerning for cannabis hyperemesis. CT with pneumomediastinum and concern for esophageal tear. CTS consulted recommending transfer to ICU and start abx.   On camera check, able to walk from stretcher to bed. Vitals reviewed and HDS.   eICU Interventions  Admit to PCCM NPO Continue abx CTS to evaluate in AM     Intervention Category Evaluation Type: New Patient Evaluation  Monnica Saltsman Slater Staff 05/27/2024, 4:15 AM

## 2024-05-27 NOTE — Progress Notes (Addendum)
 Pharmacy Antibiotic Note  Jason Hess is a 28 y.o. 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  Pharmacy has been consulted for Zosyn  dosing.  Plan: Zosyn  3.375g IV q8h (4 hour infusion). Monitor renal function, clinical status, C/S, de-escalation  Height: 5' 10 (177.8 cm) Weight: 94.3 kg (208 lb) IBW/kg (Calculated) : 73  Temp (24hrs), Avg:98 F (36.7 C), Min:98 F (36.7 C), Max:98 F (36.7 C)  Recent Labs  Lab 05/26/24 2354  WBC 10.4  CREATININE 1.10    Estimated Creatinine Clearance: 116.3 mL/min (by C-G formula based on SCr of 1.1 mg/dL).    No Known Allergies  Antimicrobials this admission: Zosyn  8/14 >> c Vanc 8/14 x1  Dose adjustments this admission: N/a  Microbiology results: 8/14 BCx: sent  Thank you for allowing pharmacy to be a part of this patient's care.  Maurilio Fila, PharmD Clinical Pharmacist 05/27/2024  1:50 AM

## 2024-05-27 NOTE — ED Provider Notes (Signed)
 [place] [facility] Provider Note   CSN: [id number] Arrival date & time: 05/26/24  2332     Patient presents with: Emesis   Jason Hess is a 28 y.o. male.   The history is provided by the patient.  Emesis Severity:  Severe Duration:  2 days Timing:  Constant Quality:  Stomach contents Progression:  Unchanged Chronicity:  Recurrent Context: not post-tussive   Relieved by:  Nothing Worsened by:  Nothing Ineffective treatments:  None tried Associated symptoms: sore throat   Associated symptoms: no fever   Associated symptoms comment:  Sore throat neck pain  Risk factors: no alcohol use   Risk factors comment:  Cannabis hyperemesis    History reviewed. No pertinent past medical history.   Prior to Admission medications   Medication Sig Start Date End Date Taking? Authorizing Provider  Acetaminophen  (CHLORASEPTIC SORE THROAT PO) Take 1-2 sprays by mouth daily as needed (sore throat).    [provider]  acetaminophen  (TYLENOL ) 325 MG tablet Take 650 mg by mouth every 6 (six) hours as needed for mild pain, fever or headache.    [provider]  dicyclomine  (BENTYL ) 20 MG tablet Take 1 tablet (20 mg total) by mouth 2 (two) times daily. 12/07/21   Autry, Lauren E, PA-C  metoCLOPramide  (REGLAN ) 10 MG tablet Take 1 tablet (10 mg total) by mouth every 6 (six) hours. 03/12/24   Odell Balls, PA-C  ondansetron  (ZOFRAN ) 4 MG tablet Take 1 tablet (4 mg total) by mouth every 6 (six) hours. 09/27/22   Curatolo, Adam, DO  ondansetron  (ZOFRAN -ODT) 8 MG disintegrating tablet Take 1 tablet (8 mg total) by mouth every 8 (eight) hours as needed for nausea or vomiting. 12/29/23   Raford Lenis, MD  potassium chloride  SA (KLOR-CON  M) 20 MEQ tablet Take 1 tablet (20 mEq total) by mouth 2 (two) times daily. 12/29/23   Raford Lenis, MD  promethazine  (PHENERGAN ) 25 MG suppository Place 1 suppository (25 mg total) rectally every 6 (six) hours as needed  for nausea or vomiting. 01/16/23   Silver Wonda LABOR, PA  sucralfate  (CARAFATE ) 1 g tablet Take 1 tablet (1 g total) by mouth 4 (four) times daily -  with meals and at bedtime for 7 days. 06/11/22 06/18/22  Elnor Jayson LABOR, DO    Allergies: Patient has no known allergies.    Review of Systems  Constitutional:  Negative for fever.  HENT:  Positive for sore throat and voice change. Negative for drooling.   Gastrointestinal:  Positive for nausea and vomiting.  Musculoskeletal:  Positive for neck pain.    Updated Vital Signs BP 116/65   Pulse 69   Temp 98 F (36.7 C) (Oral)   Resp 14   Ht 5' 10 (1.778 m)   Wt 94.3 kg   SpO2 97%   BMI 29.84 kg/m   Physical Exam Vitals and nursing note reviewed.  Constitutional:      General: He is not in acute distress.    Appearance: Normal appearance. He is well-developed. He is not diaphoretic.  HENT:     Head: Normocephalic and atraumatic.     Nose: Nose normal.  Eyes:     Conjunctiva/sclera: Conjunctivae normal.     Pupils: Pupils are equal, round, and reactive to light.  Neck:     Comments: Crepitus in neck B Cardiovascular:     Rate and Rhythm: Normal rate and regular rhythm.     Pulses: Normal pulses.  Heart sounds: Normal heart sounds.  Pulmonary:     Effort: Pulmonary effort is normal.     Breath sounds: Normal breath sounds. No wheezing or rales.  Abdominal:     General: Bowel sounds are normal.     Palpations: Abdomen is soft.     Tenderness: There is no abdominal tenderness. There is no guarding or rebound.  Musculoskeletal:        General: Normal range of motion.     Cervical back: Normal range of motion and neck supple.  Skin:    General: Skin is warm and dry.     Capillary Refill: Capillary refill takes less than 2 seconds.  Neurological:     General: No focal deficit present.     Mental Status: He is alert and oriented to person, place, and time.     Deep Tendon Reflexes: Reflexes normal.  Psychiatric:         Thought Content: Thought content normal.     (all labs ordered are listed, but only abnormal results are displayed) Results for orders placed or performed during the hospital encounter of 05/27/24  CBC with Differential   Collection Time: 05/26/24 11:54 PM  Result Value Ref Range   WBC 10.4 4.0 - 10.5 K/uL   RBC 6.05 (H) 4.22 - 5.81 MIL/uL   Hemoglobin 14.0 13.0 - 17.0 g/dL   HCT 53.9 60.9 - 47.9 %   MCV 76.0 (L) 80.0 - 100.0 fL   MCH 23.1 (L) 26.0 - 34.0 pg   MCHC 30.4 30.0 - 36.0 g/dL   RDW 86.0 88.4 - 84.4 %   Platelets 229 150 - 400 K/uL   nRBC 0.0 0.0 - 0.2 %   Neutrophils Relative % 84 %   Neutro Abs 8.7 (H) 1.7 - 7.7 K/uL   Lymphocytes Relative 13 %   Lymphs Abs 1.3 0.7 - 4.0 K/uL   Monocytes Relative 3 %   Monocytes Absolute 0.3 0.1 - 1.0 K/uL   Eosinophils Relative 0 %   Eosinophils Absolute 0.0 0.0 - 0.5 K/uL   Basophils Relative 0 %   Basophils Absolute 0.0 0.0 - 0.1 K/uL   Immature Granulocytes 0 %   Abs Immature Granulocytes 0.03 0.00 - 0.07 K/uL  Basic metabolic panel   Collection Time: 05/26/24 11:54 PM  Result Value Ref Range   Sodium 137 135 - 145 mmol/L   Potassium 3.5 3.5 - 5.1 mmol/L   Chloride 91 (L) 98 - 111 mmol/L   CO2 28 22 - 32 mmol/L   Glucose, Bld 131 (H) 70 - 99 mg/dL   BUN 17 6 - 20 mg/dL   Creatinine, Ser 8.89 0.61 - 1.24 mg/dL   Calcium 89.7 8.9 - 89.6 mg/dL   GFR, Estimated >39 >39 mL/min   Anion gap 18 (H) 5 - 15  Troponin T, High Sensitivity   Collection Time: 05/26/24 11:56 PM  Result Value Ref Range   Troponin T High Sensitivity <15 0 - 19 ng/L  Urine Drug Screen   Collection Time: 05/27/24 12:40 AM  Result Value Ref Range   Opiates NONE DETECTED NONE DETECTED   Cocaine NONE DETECTED NONE DETECTED   Benzodiazepines NONE DETECTED NONE DETECTED   Amphetamines NONE DETECTED NONE DETECTED   Tetrahydrocannabinol DETECTED (A) NONE DETECTED   Barbiturates NONE DETECTED NONE DETECTED   Methadone Scn, Ur NONE DETECTED NONE DETECTED    Fentanyl  NONE DETECTED NONE DETECTED  Troponin T, High Sensitivity   Collection Time: 05/27/24  1:47  AM  Result Value Ref Range   Troponin T High Sensitivity <15 0 - 19 ng/L   CT Chest Wo Contrast Result Date: 05/27/2024 CLINICAL DATA:  Pneumomediastinum on recent chest x-ray evaluate for possible esophageal abnormality EXAM: CT CHEST WITHOUT CONTRAST TECHNIQUE: Multidetector CT imaging of the chest was performed following the standard protocol without IV contrast. RADIATION DOSE REDUCTION: This exam was performed according to the departmental dose-optimization program which includes automated exposure control, adjustment of the mA and/or kV according to patient size and/or use of iterative reconstruction technique. COMPARISON:  Chest x-ray from earlier in the same day. FINDINGS: Cardiovascular: Somewhat limited due to lack of IV contrast. Thoracic aorta is within normal limits. Heart is not significantly enlarged. Mediastinum/Nodes: Thoracic inlet demonstrates considerable subcutaneous air which tracks into the superior mediastinum. The air tracks inferiorly in the mediastinum surrounding the esophagus. The esophagus shows contrast material within. No contrast extravasation is identified although there is an area of air within the esophageal wall distally best seen on image number 106 of series 302. The gastroesophageal junction is within normal limits. Lungs/Pleura: No pneumothorax is noted. Pneumomediastinum is seen as previously described. Lungs are well aerated without focal infiltrate or sizable effusion. Upper Abdomen: Visualized upper abdomen shows no acute abnormality. Musculoskeletal: No chest wall mass or suspicious bone lesions identified. IMPRESSION: Extensive pneumomediastinum extending into the lower cervical region similar to that seen on prior plain film. Small rent in the anterior aspect of the distal esophageal wall with air within. This is the etiology of the pneumomediastinum. No  definitive extravasation of contrast is noted. Critical Value/emergent results were called by telephone at the time of interpretation on 05/27/2024 at 1:35 am to Dr. Liyanna Cartwright , who verbally acknowledged these results. Electronically Signed   By: Oneil Devonshire M.D.   On: 05/27/2024 01:35   DG Chest Portable 1 View Result Date: 05/27/2024 CLINICAL DATA:  Nausea and vomiting for several days EXAM: PORTABLE CHEST 1 VIEW COMPARISON:  10/03/2020 FINDINGS: Cardiac shadow is within normal limits. The lungs are well aerated bilaterally. No pneumothorax is seen. Mild changes of pneumomediastinum are noted however. Air is noted in the soft tissues of the neck as well as in the supraclavicular regions bilaterally. IMPRESSION: Findings of mild pneumomediastinum extending into the neck. Given the clinical history the possibility of esophageal injury deserves consideration. CT of the chest with oral contrast administered just prior to scanning is recommended. Critical Value/emergent results were called by telephone at the time of interpretation on 05/27/2024 at 12:21 am to Dr. Jenesis Suchy , who verbally acknowledged these results. Electronically Signed   By: Oneil Devonshire M.D.   On: 05/27/2024 00:24      Radiology: CT Chest Wo Contrast Result Date: 05/27/2024 CLINICAL DATA:  Pneumomediastinum on recent chest x-ray evaluate for possible esophageal abnormality EXAM: CT CHEST WITHOUT CONTRAST TECHNIQUE: Multidetector CT imaging of the chest was performed following the standard protocol without IV contrast. RADIATION DOSE REDUCTION: This exam was performed according to the departmental dose-optimization program which includes automated exposure control, adjustment of the mA and/or kV according to patient size and/or use of iterative reconstruction technique. COMPARISON:  Chest x-ray from earlier in the same day. FINDINGS: Cardiovascular: Somewhat limited due to lack of IV contrast. Thoracic aorta is within normal limits.  Heart is not significantly enlarged. Mediastinum/Nodes: Thoracic inlet demonstrates considerable subcutaneous air which tracks into the superior mediastinum. The air tracks inferiorly in the mediastinum surrounding the esophagus. The esophagus shows contrast material within.  No contrast extravasation is identified although there is an area of air within the esophageal wall distally best seen on image number 106 of series 302. The gastroesophageal junction is within normal limits. Lungs/Pleura: No pneumothorax is noted. Pneumomediastinum is seen as previously described. Lungs are well aerated without focal infiltrate or sizable effusion. Upper Abdomen: Visualized upper abdomen shows no acute abnormality. Musculoskeletal: No chest wall mass or suspicious bone lesions identified. IMPRESSION: Extensive pneumomediastinum extending into the lower cervical region similar to that seen on prior plain film. Small rent in the anterior aspect of the distal esophageal wall with air within. This is the etiology of the pneumomediastinum. No definitive extravasation of contrast is noted. Critical Value/emergent results were called by telephone at the time of interpretation on 05/27/2024 at 1:35 am to Dr. Casady Voshell , who verbally acknowledged these results. Electronically Signed   By: Oneil Devonshire M.D.   On: 05/27/2024 01:35   DG Chest Portable 1 View Result Date: 05/27/2024 CLINICAL DATA:  Nausea and vomiting for several days EXAM: PORTABLE CHEST 1 VIEW COMPARISON:  10/03/2020 FINDINGS: Cardiac shadow is within normal limits. The lungs are well aerated bilaterally. No pneumothorax is seen. Mild changes of pneumomediastinum are noted however. Air is noted in the soft tissues of the neck as well as in the supraclavicular regions bilaterally. IMPRESSION: Findings of mild pneumomediastinum extending into the neck. Given the clinical history the possibility of esophageal injury deserves consideration. CT of the chest with oral  contrast administered just prior to scanning is recommended. Critical Value/emergent results were called by telephone at the time of interpretation on 05/27/2024 at 12:21 am to Dr. Blessen Kimbrough , who verbally acknowledged these results. Electronically Signed   By: Oneil Devonshire M.D.   On: 05/27/2024 00:24     .Critical Care  Performed by: Nettie Earing, MD Authorized by: Nettie Earing, MD   Critical care provider statement:    Critical care time (minutes):  60   Critical care end time:  05/27/2024 2:25 AM   Critical care was necessary to treat or prevent imminent or life-threatening deterioration of the following conditions: Booerhaave.   Critical care was time spent personally by me on the following activities:  Development of treatment plan with patient or surrogate, discussions with consultants, evaluation of patient's response to treatment, examination of patient, ordering and review of laboratory studies, ordering and review of radiographic studies, ordering and performing treatments and interventions, pulse oximetry, re-evaluation of patient's condition and review of old charts   I assumed direction of critical care for this patient from another provider in my specialty: no     Care discussed with: admitting provider   Comments:     ICU orders placed by me     Medications  vancomycin  (VANCOCIN ) IVPB 1000 mg/200 mL premix (1,000 mg Intravenous New Bag/Given 05/27/24 0204)    Followed by  vancomycin  (VANCOCIN ) IVPB 1000 mg/200 mL premix (has no administration in time range)  diatrizoate  meglumine -sodium (GASTROGRAFIN ) 66-10 % solution 30 mL (30 mLs Oral Given 05/27/24 0059)  sodium chloride  0.9 % bolus 2,000 mL (0 mLs Intravenous Stopped 05/27/24 0135)  ondansetron  (ZOFRAN ) injection 4 mg (4 mg Intravenous Given 05/27/24 0200)                                    Medical Decision Making Amount and/or Complexity of Data Reviewed External Data Reviewed: labs and notes.  Details:  Previous ED visits reviewed  Labs: ordered.    Details: UDS positive for THC, troponin negative, normal white count 10.4, normal hemoglobin 14, normal platelets. Normal sodium 137, normal potassium 3.5, normal creatinine. Blood cultures sent  Radiology: ordered and independent interpretation performed.    Details: Pneumomediastinum by me  ECG/medicine tests: ordered and independent interpretation performed.    Details: Borderline RAD Discussion of management or test interpretation with external provider(s): 1:49 AM case d/w Dr. Kerrin of thoracic surgery, please admit to PCCM and start antibiotics.   2:21 AM case d/w Dr. Layman of thoracic surgery who will admit patient to ICU    Risk Prescription drug management. Decision regarding hospitalization.     Final diagnoses:  Boerhaave syndrome   The patient appears reasonably stabilized for admission considering the current resources, flow, and capabilities available in the ED at this time, and I doubt any other [facility] requiring further screening and/or treatment in the ED prior to admission.  ED Discharge Orders     None          Rocsi Hazelbaker, MD 05/27/24 0225

## 2024-05-27 NOTE — Consult Note (Signed)
 Reason for Consult:esophageal perforation Referring Physician: [facility]  [place] Jason Hess is an 28 y.o. male.  HPI: 28 yo man with history of marijuana use who presented with nausea, vomiting, and neck and chest pain.  Pain became severe and he went to [facility].  W/u showed pneumomediastinum and likely very small esophageal perforation.  Also c/o severe hiccups which started after pain.  Currently still has pain but it has eased off since initial presentation.  History reviewed. No pertinent past medical history.  History reviewed. No pertinent surgical history.  History reviewed. No pertinent family history.  Social History:  reports that he has never smoked. He has never used smokeless tobacco. He reports current alcohol use. He reports that he does not currently use drugs after having used the following drugs: Marijuana. Frequency: 3.00 times per week.  Allergies: No Known Allergies  Medications: Prior to Admission:  Medications Prior to Admission  Medication Sig Dispense Refill Last Dose/Taking   Acetaminophen  (CHLORASEPTIC SORE THROAT PO) Take 1-2 sprays by mouth daily as needed (sore throat).      acetaminophen  (TYLENOL ) 325 MG tablet Take 650 mg by mouth every 6 (six) hours as needed for mild pain, fever or headache.      dicyclomine  (BENTYL ) 20 MG tablet Take 1 tablet (20 mg total) by mouth 2 (two) times daily. 20 tablet 0    metoCLOPramide  (REGLAN ) 10 MG tablet Take 1 tablet (10 mg total) by mouth every 6 (six) hours. 30 tablet 0    ondansetron  (ZOFRAN ) 4 MG tablet Take 1 tablet (4 mg total) by mouth every 6 (six) hours. 12 tablet 0    ondansetron  (ZOFRAN -ODT) 8 MG disintegrating tablet Take 1 tablet (8 mg total) by mouth every 8 (eight) hours as needed for nausea or vomiting. 20 tablet 0    potassium chloride  SA (KLOR-CON  M) 20 MEQ tablet Take 1 tablet (20 mEq total) by mouth 2 (two) times daily. 20 tablet 0    promethazine  (PHENERGAN ) 25 MG suppository Place 1 suppository (25 mg  total) rectally every 6 (six) hours as needed for nausea or vomiting. 12 each 0    sucralfate  (CARAFATE ) 1 g tablet Take 1 tablet (1 g total) by mouth 4 (four) times daily -  with meals and at bedtime for 7 days. 28 tablet 0     Results for orders placed or performed during the hospital encounter of 05/27/24 (from the past 48 hours)  CBC with Differential     Status: Abnormal   Collection Time: 05/26/24 11:54 PM  Result Value Ref Range   WBC 10.4 4.0 - 10.5 K/uL   RBC 6.05 (H) 4.22 - 5.81 MIL/uL   Hemoglobin 14.0 13.0 - 17.0 g/dL   HCT 53.9 60.9 - 47.9 %   MCV 76.0 (L) 80.0 - 100.0 fL   MCH 23.1 (L) 26.0 - 34.0 pg   MCHC 30.4 30.0 - 36.0 g/dL   RDW 86.0 88.4 - 84.4 %   Platelets 229 150 - 400 K/uL   nRBC 0.0 0.0 - 0.2 %   Neutrophils Relative % 84 %   Neutro Abs 8.7 (H) 1.7 - 7.7 K/uL   Lymphocytes Relative 13 %   Lymphs Abs 1.3 0.7 - 4.0 K/uL   Monocytes Relative 3 %   Monocytes Absolute 0.3 0.1 - 1.0 K/uL   Eosinophils Relative 0 %   Eosinophils Absolute 0.0 0.0 - 0.5 K/uL   Basophils Relative 0 %   Basophils Absolute 0.0 0.0 - 0.1 K/uL  Immature Granulocytes 0 %   Abs Immature Granulocytes 0.03 0.00 - 0.07 K/uL    Comment: Performed at [facility], [address] [place], [place] [address]  Basic metabolic panel     Status: Abnormal   Collection Time: 05/26/24 11:54 PM  Result Value Ref Range   Sodium 137 135 - 145 mmol/L   Potassium 3.5 3.5 - 5.1 mmol/L   Chloride 91 (L) 98 - 111 mmol/L   CO2 28 22 - 32 mmol/L   Glucose, Bld 131 (H) 70 - 99 mg/dL    Comment: Glucose reference range applies only to samples taken after fasting for at least 8 hours.   BUN 17 6 - 20 mg/dL   Creatinine, Ser 8.89 0.61 - 1.24 mg/dL   Calcium 89.7 8.9 - 89.6 mg/dL   GFR, Estimated >39 >39 mL/min    Comment: (NOTE) Calculated using the CKD-EPI Creatinine Equation (2021)    Anion gap 18 (H) 5 - 15    Comment: Performed at [facility], [address] [place], [place] [address]  Troponin T, High Sensitivity     Status: None   Collection Time: 05/26/24 11:56 PM  Result Value Ref Range   Troponin T High Sensitivity <15 0 - 19 ng/L    Comment: (NOTE) Biotin concentrations > 1000 ng/mL falsely decrease TnT results.  Serial cardiac troponin measurements are suggested.  Refer to the Links section for chest pain algorithms and additional  guidance. Performed at [facility], [address] [place], [place] [address]   Urine Drug Screen     Status: Abnormal   Collection Time: 05/27/24 12:40 AM  Result Value Ref Range   Opiates NONE DETECTED NONE DETECTED   Cocaine NONE DETECTED NONE DETECTED   Benzodiazepines NONE DETECTED NONE DETECTED   Amphetamines NONE DETECTED NONE DETECTED   Tetrahydrocannabinol DETECTED (A) NONE DETECTED   Barbiturates NONE DETECTED NONE DETECTED   Methadone Scn, Ur NONE DETECTED NONE DETECTED   Fentanyl  NONE DETECTED NONE DETECTED    Comment: (NOTE) Drug Screen for Medical Purposes only. If confirmation is needed for any purpose, notify lab within 5 days. Drug Class                 Cutoff (ng/mL) Amphetamine and metabolites 1000 Barbiturate and metabolites 200 Benzodiazepine              200 Opiates and metabolites     300 Cocaine and metabolites     300 THC                         50 Fentanyl                     5 Methadone                   300  Trazodone is metabolized in vivo to several metabolites,  including pharmacologically active m-CPP, which is excreted in the  urine.  Immunoassay screens for amphetamines and MDMA have potential  cross-reactivity with these compounds and may provide false positive  result.  Performed at [facility], [address] [place], [place] [address]   Troponin T, High Sensitivity     Status: None   Collection Time: 05/27/24  1:47 AM  Result Value Ref Range   Troponin T High Sensitivity <15 0 - 19 ng/L    Comment: (NOTE)  Biotin concentrations >  1000 ng/mL falsely decrease TnT results.  Serial cardiac troponin measurements are suggested.  Refer to the Links section for chest pain algorithms and additional  guidance. Performed at [facility], [address] [place], [place] [address]     CT Chest Wo Contrast Result Date: 05/27/2024 CLINICAL DATA:  Pneumomediastinum on recent chest x-ray evaluate for possible esophageal abnormality EXAM: CT CHEST WITHOUT CONTRAST TECHNIQUE: Multidetector CT imaging of the chest was performed following the standard protocol without IV contrast. RADIATION DOSE REDUCTION: This exam was performed according to the departmental dose-optimization program which includes automated exposure control, adjustment of the mA and/or kV according to patient size and/or use of iterative reconstruction technique. COMPARISON:  Chest x-ray from earlier in the same day. FINDINGS: Cardiovascular: Somewhat limited due to lack of IV contrast. Thoracic aorta is within normal limits. Heart is not significantly enlarged. Mediastinum/Nodes: Thoracic inlet demonstrates considerable subcutaneous air which tracks into the superior mediastinum. The air tracks inferiorly in the mediastinum surrounding the esophagus. The esophagus shows contrast material within. No contrast extravasation is identified although there is an area of air within the esophageal wall distally best seen on image number 106 of series 302. The gastroesophageal junction is within normal limits. Lungs/Pleura: No pneumothorax is noted. Pneumomediastinum is seen as previously described. Lungs are well aerated without focal infiltrate or sizable effusion. Upper Abdomen: Visualized upper abdomen shows no acute abnormality. Musculoskeletal: No chest wall mass or suspicious bone lesions identified. IMPRESSION: Extensive pneumomediastinum extending into the lower cervical region similar to that seen on prior plain film. Small rent in the anterior aspect of the distal  esophageal wall with air within. This is the etiology of the pneumomediastinum. No definitive extravasation of contrast is noted. Critical Value/emergent results were called by telephone at the time of interpretation on 05/27/2024 at 1:35 am to Dr. APRIL PALUMBO , who verbally acknowledged these results. Electronically Signed   By: Oneil Devonshire M.D.   On: 05/27/2024 01:35   DG Chest Portable 1 View Result Date: 05/27/2024 CLINICAL DATA:  Nausea and vomiting for several days EXAM: PORTABLE CHEST 1 VIEW COMPARISON:  10/03/2020 FINDINGS: Cardiac shadow is within normal limits. The lungs are well aerated bilaterally. No pneumothorax is seen. Mild changes of pneumomediastinum are noted however. Air is noted in the soft tissues of the neck as well as in the supraclavicular regions bilaterally. IMPRESSION: Findings of mild pneumomediastinum extending into the neck. Given the clinical history the possibility of esophageal injury deserves consideration. CT of the chest with oral contrast administered just prior to scanning is recommended. Critical Value/emergent results were called by telephone at the time of interpretation on 05/27/2024 at 12:21 am to Dr. APRIL PALUMBO , who verbally acknowledged these results. Electronically Signed   By: Oneil Devonshire M.D.   On: 05/27/2024 00:24    Review of Systems  HENT:  Positive for trouble swallowing.   Cardiovascular:  Positive for chest pain.  Gastrointestinal:  Positive for nausea and vomiting.  Musculoskeletal:  Positive for neck pain.   Blood pressure (!) 145/98, pulse (!) 59, temperature 98 F (36.7 C), temperature source Oral, resp. rate 13, height 5' 10 (1.778 m), weight 94.3 kg, SpO2 100%. Physical Exam Vitals reviewed.  Constitutional:      General: He is in acute distress (mild).  HENT:     Head: Normocephalic and atraumatic.  Eyes:     General: No scleral icterus.    Extraocular Movements: Extraocular movements intact.  Cardiovascular:     Rate and  Rhythm: Normal rate and regular rhythm.     Heart sounds: No murmur heard. Pulmonary:     Effort: Pulmonary effort is normal. No respiratory distress.     Breath sounds: Normal breath sounds. No wheezing or rales.  Abdominal:     Palpations: Abdomen is soft.     Tenderness: There is no abdominal tenderness.  Skin:    General: Skin is warm and dry.  Neurological:     General: No focal deficit present.     Mental Status: He is alert and oriented to person, place, and time.     Cranial Nerves: No cranial nerve deficit.     Motor: No weakness.     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  Ct showed pneumomediastinum and a likely small distal esophageal perforation.  He is not septic and there was no extravasation of contrast on CT.  He can likely be managed nonoperatively with NPO, IVF and IV antibiotics.  Has been started on Vancomycin , Zosyn  and Diflucan   Will follow  Elspeth JAYSON Millers 05/27/2024, 5:41 AM

## 2024-05-27 NOTE — H&P (Signed)
 NAME:  [place] Klausing, MRN:  [id number], DOB:  Nov 10, 1995, LOS: 0 ADMISSION DATE:  05/27/2024, CONSULTATION DATE:  8/14 REFERRING MD: Dr. Nettie, CHIEF COMPLAINT: Esophageal perforation  History of Present Illness:  28 year old male with no significant past medical history who is a frequent user of marijuana and a social drinker.  [facility] with complaints of nausea, vomiting, neck pain, and throat pain.  CT scan of the chest demonstrated extensive pneumomediastinum extending to the lower cervical region and a segment in the anterior aspect of the distal esophageal wall with air within concern for esophageal perforation.  Cardiothoracic surgery was consulted and recommended [facility] to [facility] ICU with [facility]M admitting. Chief complaint currently is painful hiccups.  Pertinent  Medical History   has no past medical history on file.   Significant Hospital Events: Including procedures, antibiotic start and stop dates in addition to other pertinent events     Interim History / Subjective:    Objective    Blood pressure (!) 149/95, pulse 61, temperature 98 F (36.7 C), temperature source Oral, resp. rate (!) 9, height 5' 10 (1.778 m), weight 94.3 kg, SpO2 96%.        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  - Zosyn , fluconazole  - TCTS to see in AM per EDP - Pain management PRN morphine  - NPO - PRN zofran  - LR @ 100  Borderline hypokalemia - KCL  IV  Best Practice (right click and Reselect all SmartList Selections daily)   Diet/type: NPO DVT prophylaxis LMWH Pressure ulcer(s): pressure ulcer assessment deferred  GI prophylaxis: PPI Lines: N/A Foley:  N/A Code Status:  full code Last date of multidisciplinary goals of care discussion [ ]   Labs   CBC: Recent Labs  Lab 05/26/24 2354  WBC 10.4  NEUTROABS 8.7*  HGB 14.0  HCT 46.0  MCV 76.0*  PLT 229    Basic Metabolic Panel: Recent Labs  Lab 05/26/24 2354  NA 137  K 3.5  CL 91*  CO2 28  GLUCOSE 131*  BUN 17  CREATININE 1.10  CALCIUM 10.2   GFR: Estimated Creatinine Clearance: 116.3 mL/min (by C-G formula based on SCr of 1.1 mg/dL). Recent Labs  Lab 05/26/24 2354  WBC 10.4    Liver Function Tests: No results for input(s): AST, ALT, ALKPHOS, BILITOT, PROT, ALBUMIN in the last 168 hours. No results for input(s): LIPASE, AMYLASE in the last 168 hours. No results for input(s): AMMONIA in the last 168 hours.  ABG    Component Value Date/Time   TCO2 39 (H) 11/27/2023 1853     Coagulation Profile: No results for input(s): INR, PROTIME in the last 168 hours.  Cardiac Enzymes: No results for input(s): CKTOTAL, CKMB, CKMBINDEX, TROPONINI in the last 168 hours.  HbA1C: No results found for: HGBA1C  CBG: No results for input(s): GLUCAP in the last 168 hours.  Review of Systems:   Bolds are positive  Constitutional: weight loss,  gain, night sweats, Fevers, chills, fatigue .  HEENT: headaches, Sore throat, sneezing, nasal congestion, post nasal drip, Difficulty swallowing, Tooth/dental problems, visual complaints visual changes, ear ache CV:  chest pain, radiates:,Orthopnea, PND, swelling in lower extremities, dizziness, palpitations, syncope.  GI  heartburn, indigestion, abdominal pain, nausea, vomiting, diarrhea, change in bowel habits, loss of appetite, bloody stools.  Resp: cough, productive: , hemoptysis,  dyspnea, chest pain, pleuritic.  Skin: rash or itching or icterus GU: dysuria, change in color of urine, urgency or frequency. flank pain, hematuria  MS: joint pain or swelling. decreased range of motion  Psych: change in mood or affect. depression or anxiety.  Neuro: difficulty with speech, weakness, numbness, ataxia    Past Medical History:  He,  has no past medical history on file.   Surgical History:  History reviewed. No pertinent surgical history.   Social History:   reports that he has never smoked. He has never used smokeless tobacco. He reports current alcohol use. He reports that he does not currently use drugs after having used the following drugs: Marijuana. Frequency: 3.00 times per week.   Family History:  His family history is not on file.   Allergies No Known Allergies   Home Medications  Prior to Admission medications   Medication Sig Start Date End Date Taking? Authorizing Provider  Acetaminophen  (CHLORASEPTIC SORE THROAT PO) Take 1-2 sprays by mouth daily as needed (sore throat).    [provider]  acetaminophen  (TYLENOL ) 325 MG tablet Take 650 mg by mouth every 6 (six) hours as needed for mild pain, fever or headache.    [provider]  dicyclomine  (BENTYL ) 20 MG tablet Take 1 tablet (20 mg total) by mouth 2 (two) times daily. 12/07/21   Autry, Lauren E, PA-C  metoCLOPramide  (REGLAN ) 10 MG tablet Take 1 tablet (10 mg total) by mouth every 6 (six) hours. 03/12/24   Odell Balls, PA-C  ondansetron  (ZOFRAN ) 4 MG tablet Take 1 tablet (4 mg total) by mouth every 6 (six) hours. 09/27/22   Curatolo, Adam, DO  ondansetron  (ZOFRAN -ODT) 8 MG disintegrating tablet Take 1 tablet (8 mg total) by mouth every 8 (eight) hours as needed for nausea or vomiting. 12/29/23   Raford Lenis, MD  potassium chloride  SA (KLOR-CON  M) 20 MEQ tablet Take 1 tablet (20 mEq total) by mouth 2 (two) times daily. 12/29/23   Raford Lenis, MD  promethazine  (PHENERGAN ) 25 MG  suppository Place 1 suppository (25 mg total) rectally every 6 (six) hours as needed for nausea or vomiting. 01/16/23   Silver Wonda LABOR, PA  sucralfate  (CARAFATE ) 1 g tablet Take 1 tablet (1 g total) by mouth 4 (four) times daily -  with meals and at bedtime for 7 days. 06/11/22 06/18/22  Elnor Jayson LABOR, DO     Critical care time:      Deward Eastern, AGACNP-BC [facility]  See Amion for personal pager PCCM on call pager [phone] until 7pm. Please call Elink 7p-7a. [phone]  05/27/2024 5:05 AM

## 2024-05-27 NOTE — Progress Notes (Addendum)
 Transition of Care [facility]) - Inpatient Brief Assessment   Patient Details  Name: Jason Hess MRN: [id number] Date of Birth: [birth year]  [facility] CM/SW Contact:    Rosaline JONELLE Joe, RN Phone Number: 05/27/2024, 3:34 PM   Clinical Narrative: Patient admitted from home alone with Boerhaave syndrome.  Patient with recent marijuana use.  Patient is independent prior to admission and plans to return home when stable.  Patient is currently npo and receiving IV antibiotics.  Patient has no PCP and will be set up with appointment prior to discharge.  Resources placed for substance abuse counseling and information regarding hyperemesis relating to marijuana use.  No other IP Care management needs at this time.   Transition of Care Asessment: Insurance and Status: (P) Insurance coverage has been reviewed Patient has primary care physician: (P) Yes Home environment has been reviewed: (P) from home alone Prior level of function:: (P) self Prior/Current Home Services: (P) No current home services Social Drivers of Health Review: (P) SDOH reviewed interventions complete Readmission risk has been reviewed: (P) Yes Transition of care needs: (P) transition of care needs identified, TOC will continue to follow

## 2024-05-28 ENCOUNTER — Inpatient Hospital Stay (HOSPITAL_COMMUNITY)

## 2024-05-28 DIAGNOSIS — E663 Overweight: Secondary | ICD-10-CM

## 2024-05-28 DIAGNOSIS — E876 Hypokalemia: Secondary | ICD-10-CM | POA: Diagnosis not present

## 2024-05-28 DIAGNOSIS — J982 Interstitial emphysema: Secondary | ICD-10-CM

## 2024-05-28 DIAGNOSIS — F12188 Cannabis abuse with other cannabis-induced disorder: Secondary | ICD-10-CM | POA: Diagnosis not present

## 2024-05-28 DIAGNOSIS — K223 Perforation of esophagus: Secondary | ICD-10-CM | POA: Diagnosis not present

## 2024-05-28 LAB — BASIC METABOLIC PANEL WITH GFR
Anion gap: 10 (ref 5–15)
BUN: 10 mg/dL (ref 6–20)
CO2: 28 mmol/L (ref 22–32)
Calcium: 8.8 mg/dL — ABNORMAL LOW (ref 8.9–10.3)
Chloride: 104 mmol/L (ref 98–111)
Creatinine, Ser: 1.18 mg/dL (ref 0.61–1.24)
GFR, Estimated: >60 mL/min (ref >60)
Glucose, Bld: 78 mg/dL (ref 70–99)
Potassium: 3.4 mmol/L — ABNORMAL LOW (ref 3.5–5.1)
Sodium: 142 mmol/L (ref 135–145)

## 2024-05-28 LAB — CBC
HCT: 36.6 % — ABNORMAL LOW (ref 39.0–52.0)
Hemoglobin: 10.9 g/dL — ABNORMAL LOW (ref 13.0–17.0)
MCH: 23.2 pg — ABNORMAL LOW (ref 26.0–34.0)
MCHC: 29.8 g/dL — ABNORMAL LOW (ref 30.0–36.0)
MCV: 78 fL — ABNORMAL LOW (ref 80.0–100.0)
Platelets: 165 K/uL (ref 150–400)
RBC: 4.69 MIL/uL (ref 4.22–5.81)
RDW: 14 % (ref 11.5–15.5)
WBC: 7.9 K/uL (ref 4.0–10.5)
nRBC: 0 % (ref 0.0–0.2)

## 2024-05-28 LAB — PHOSPHORUS: Phosphorus: 2.8 mg/dL (ref 2.5–4.6)

## 2024-05-28 LAB — MAGNESIUM: Magnesium: 1.8 mg/dL (ref 1.7–2.4)

## 2024-05-28 MED ORDER — IOHEXOL 300 MG/ML  SOLN
100.0000 mL | Freq: Once | INTRAMUSCULAR | Status: AC | PRN
  Administered 2024-05-28: 100 mL via ORAL

## 2024-05-28 MED ORDER — SODIUM CHLORIDE 0.9 % IV SOLN: INTRAVENOUS | Status: DC

## 2024-05-28 MED ORDER — POTASSIUM CHLORIDE 10 MEQ/100ML IV SOLN
10.0000 meq | INTRAVENOUS | Status: AC
  Administered 2024-05-28 (×2): 10 meq via INTRAVENOUS
  Filled 2024-05-28 (×2): qty 100

## 2024-05-28 NOTE — Progress Notes (Signed)
   [address] [place] [address]             [phone]     Subjective: Feels better, no pain  Objective: Vital signs in last 24 hours: Temp:  [98 F (36.7 C)-99 F (37.2 C)] 98.3 F (36.8 C) (08/15 0811) Pulse Rate:  [44-75] 75 (08/15 0811) Resp:  [11-26] 18 (08/15 0811) BP: (103-131)/(48-81) 131/71 (08/15 0811) SpO2:  [91 %-100 %] 99 % (08/15 0811)  Hemodynamic parameters for last 24 hours:    Intake/Output from previous day: 08/14 0701 - 08/15 0700 In: 1158.9 [I.V.:846.5; IV Piggyback:312.3] Out: 350 [Urine:350] Intake/Output this shift: No intake/output data recorded.  General appearance: alert, cooperative, and no distress Heart: regular rate and rhythm Lungs: clear to auscultation bilaterally Abdomen: normal findings: soft, non-tender  Lab Results: Recent Labs    05/27/24 0841 05/28/24 0221  WBC 11.3* 7.9  HGB 11.7* 10.9*  HCT 37.9* 36.6*  PLT 167 165   BMET:  Recent Labs    05/27/24 0841 05/28/24 0221  NA 140 142  K 3.4* 3.4*  CL 100 104  CO2 27 28  GLUCOSE 105* 78  BUN 13 10  CREATININE 1.04 1.18  CALCIUM 8.9 8.8*    PT/INR:  Recent Labs    05/27/24 0841  LABPROT 14.1  INR 1.0   ABG    Component Value Date/Time   TCO2 39 (H) 11/27/2023 1853   CBG (last 3)  No results for input(s): GLUCAP in the last 72 hours.  Assessment/Plan: S/P  - Looks good this morning CXR shows no pleural effusion and no evidence of extravasation of contrast Will have Radiology do a swallow with water soluble contrast today   LOS: 1 day    [facility] 05/28/2024

## 2024-05-28 NOTE — Hospital Course (Addendum)
 28-year-old male with no significant past medical history who is a frequent user of marijuana and a social drinker.  [facility] with complaints of nausea, vomiting, neck pain, and throat pain.  CT scan of the chest demonstrated extensive pneumomediastinum extending to the lower cervical region and a segment in the anterior aspect of the distal esophageal wall with air within concern for esophageal perforation.  Cardiothoracic surgery was consulted and recommended transfer to [facility] with [facility] admitting. Chief complaint currently is painful hiccups.   8/15.  Patient feels fine.  Offers no complaints.  Complains of being thirsty.  Esophagram does not show any extravasation of fluid.  Will start clear liquid diet.  Spoke with cardiothoracic surgery and they recommended clear liquid diet through the weekend.

## 2024-05-28 NOTE — Assessment & Plan Note (Signed)
 Secondary to esophageal rupture.  Conservative management as per cardiothoracic surgery.  Patient not having any pain.

## 2024-05-28 NOTE — Assessment & Plan Note (Addendum)
 Patient admitted with ruptured esophagus and pneumomediastinum.  Seen by cardiothoracic surgery recommended conservative management.  Esophagram showing no extravasation of fluid.  Tried to message cardiothoracic surgery.  Started on clear liquid diet.  On empiric Zosyn  and fluconazole .

## 2024-05-28 NOTE — Plan of Care (Signed)

## 2024-05-28 NOTE — Assessment & Plan Note (Signed)
Advised to stop smoking marijuana.

## 2024-05-28 NOTE — Assessment & Plan Note (Signed)
 Replace potassium IV.

## 2024-05-28 NOTE — Assessment & Plan Note (Signed)
 -  BMI 28.63

## 2024-05-28 NOTE — Plan of Care (Signed)

## 2024-05-28 NOTE — Progress Notes (Signed)
  Progress Note   Patient: Jason Hess FMW:[id number] DOB: 06-22-1996 DOA: 05/27/2024     1 DOS: the patient was seen and examined on 05/28/2024   Brief hospital course: 28-year-old male with no significant past medical history who is a frequent user of marijuana and a social drinker.  [facility] with complaints of nausea, vomiting, neck pain, and throat pain.  CT scan of the chest demonstrated extensive pneumomediastinum extending to the lower cervical region and a segment in the anterior aspect of the distal esophageal wall with air within concern for esophageal perforation.  Cardiothoracic surgery was consulted and recommended transfer to [facility], ICU with PCCM admitting. Chief complaint currently is painful hiccups.   8/15.  Patient feels fine.  Offers no complaints.  Complains of being thirsty.  Esophagram does not show any extravasation of fluid.  Will start clear liquid diet.  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  Seen by cardiothoracic surgery recommended conservative management.  Esophagram showing no extravasation of fluid.  Tried to message cardiothoracic surgery.  Started on clear liquid diet.  On empiric Zosyn  and fluconazole .  Pneumomediastinum (HCC) Secondary to esophageal rupture.  Conservative management as per cardiothoracic surgery.  Patient not having any pain.  Hypokalemia Replace potassium IV.  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  Overweight (BMI 25.0-29.9) BMI 28.63        Subjective: Patient feels fine.  Offers no complaints.  No vomiting since presenting to the emergency room.  Very thirsty.  Admitted with ruptured esophagus and pneumomediastinum.  Physical Exam: Vitals:   05/28/24 0000 05/28/24 0407 05/28/24 0811 05/28/24 1207  BP: 120/72 (!) 103/48 131/71 111/67  Pulse: (!) 51 (!) 47 75  67  Resp: 16 16 18 17   Temp: 98.8 F (37.1 C) 98.4 F (36.9 C) 98.3 F (36.8 C) 98.1 F (36.7 C)  TempSrc: Oral Oral    SpO2: 100% 97% 99% 100%  Weight:      Height:       Physical Exam HENT:     Head: Normocephalic.  Eyes:     General: Lids are normal.     Conjunctiva/sclera: Conjunctivae normal.  Cardiovascular:     Rate and Rhythm: Normal rate and regular rhythm.     Heart sounds: Normal heart sounds, S1 normal and S2 normal.  Pulmonary:     Breath sounds: No decreased breath sounds, wheezing, rhonchi or rales.  Abdominal:     Palpations: Abdomen is soft.     Tenderness: There is no abdominal tenderness.  Musculoskeletal:     Right lower leg: No swelling.     Left lower leg: No swelling.  Skin:    General: Skin is warm.     Findings: No rash.  Neurological:     Mental Status: He is alert and oriented to person, place, and time.     Data Reviewed: Potassium 3.4, creatinine 1.18, magnesium  1.8, phosphorus 2.8, white blood cell count 7.9, hemoglobin 10.9, platelet count 165 Esophagram shows unremarkable limited esophagram no extravasation of contrast from esophagus.  Family Communication: Refused  Disposition: Status is: Inpatient Remains inpatient appropriate because: Try to advance to clear liquid diet and see how he does.  Planned Discharge Destination: Home    Time spent: 28 minutes  Author: Charlie Patterson, MD 05/28/2024 1:57 PM  For on call review [email or web address].

## 2024-05-29 DIAGNOSIS — E876 Hypokalemia: Secondary | ICD-10-CM | POA: Diagnosis not present

## 2024-05-29 DIAGNOSIS — F12188 Cannabis abuse with other cannabis-induced disorder: Secondary | ICD-10-CM | POA: Diagnosis not present

## 2024-05-29 DIAGNOSIS — K223 Perforation of esophagus: Secondary | ICD-10-CM | POA: Diagnosis not present

## 2024-05-29 DIAGNOSIS — J982 Interstitial emphysema: Secondary | ICD-10-CM | POA: Diagnosis not present

## 2024-05-29 MED ORDER — POTASSIUM CHLORIDE 20 MEQ PO PACK
40.0000 meq | PACK | Freq: Every day | ORAL | Status: DC
  Administered 2024-05-29 – 2024-05-31 (×3): 40 meq via ORAL
  Filled 2024-05-29 (×3): qty 2

## 2024-05-29 NOTE — Plan of Care (Signed)

## 2024-05-29 NOTE — Progress Notes (Addendum)
      [address] [place] [address]             [phone]       Subjective:  Patient has no new complaints.  States he has no experienced any further N/V since admission.  He was hoping he could have some food today.  Tolerated the liquid diet last evening.  I did explain the importance of slow diet advancement and it would likely be Monday before he is on regular diet.  Objective: Vital signs in last 24 hours: Temp:  [98 F (36.7 C)-98.6 F (37 C)] 98 F (36.7 C) (08/16 0826) Pulse Rate:  [52-70] 52 (08/16 0826) Resp:  [16-18] 16 (08/16 0826) BP: (111-135)/(67-85) 122/82 (08/16 0826) SpO2:  [98 %-100 %] 100 % (08/16 0826) Weight:  [95.2 kg] 95.2 kg (08/16 0413)  Intake/Output from previous day: 08/15 0701 - 08/16 0700 In: 156.5 [I.V.:156.5] Out: -   General appearance: alert, cooperative, and no distress Heart: regular rate and rhythm Lungs: clear to auscultation bilaterally Abdomen: soft, non-tender; bowel sounds normal; no masses,  no organomegaly  Lab Results: Recent Labs    05/27/24 0841 05/28/24 0221  WBC 11.3* 7.9  HGB 11.7* 10.9*  HCT 37.9* 36.6*  PLT 167 165   BMET:  Recent Labs    05/27/24 0841 05/28/24 0221  NA 140 142  K 3.4* 3.4*  CL 100 104  CO2 27 28  GLUCOSE 105* 78  BUN 13 10  CREATININE 1.04 1.18  CALCIUM 8.9 8.8*    PT/INR:  Recent Labs    05/27/24 0841  LABPROT 14.1  INR 1.0   ABG    Component Value Date/Time   TCO2 39 (H) 11/27/2023 1853   CBG (last 3)  No results for input(s): GLUCAP in the last 72 hours.  Assessment/Plan:  Suspected Boerhave's after N/V likely associated to Marijuana use... CT scan was concerning for perforation, however esophogram yesterday showed no evidence of leak  Plan: will continue CLD today if no issues will advance to full liquids tomorrow.. care per medicine   LOS: 2 days    Rocky Shad, PA-C 05/29/2024   Chart reviewed, patient examined, agree with  above.  He has felt fine after clear liquids. Will advance to full liquids now and if no problems he can start soft regular diet in am.

## 2024-05-29 NOTE — Progress Notes (Signed)
 Progress Note   Patient: Jason Hess FMW:[id number] DOB: 1996/10/02 DOA: 05/27/2024     2 DOS: the patient was seen and examined on 05/29/2024   Brief hospital course: 28-year-old male with no significant past medical history who is a frequent user of marijuana and a social drinker.  [facility] with complaints of nausea, vomiting, neck pain, and throat pain.  CT scan of the chest demonstrated extensive pneumomediastinum extending to the lower cervical region and a segment in the anterior aspect of the distal esophageal wall with air within concern for esophageal perforation.  Cardiothoracic surgery was consulted and recommended transfer to [facility], ICU with [facility] admitting. Esophagram showing no extravasation of fluid.  Started on clear liquid diet.  On empiric Zosyn  and fluconazole .   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  Seen by cardiothoracic surgery recommended conservative management.  Esophagram showing no extravasation of fluid.  Started on clear liquid diet with slow advancement as per CT surgery. Continue empiric Zosyn  and fluconazole  therapy.  Pneumomediastinum (HCC) Secondary to esophageal rupture.  Conservative management as per cardiothoracic surgery.  Patient not having any pain.  Hypokalemia Oral potassium replacement ordered.  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      Out of bed to chair. Incentive spirometry. Nursing supportive care. Fall, aspiration precautions. Diet:  Diet Orders (From admission, onward)     Start     Ordered   05/29/24 1149  Diet full liquid Room service appropriate? Yes; Fluid consistency: Thin  Diet effective now       Question Answer Comment  Room service appropriate? Yes   Fluid consistency: Thin      05/29/24 1148           DVT prophylaxis: enoxaparin  (LOVENOX )  injection 40 mg Start: 05/27/24 1000  Level of care: Med-Surg   Code Status: Full Code  Subjective: Patient is seen and examined today morning. Sitting on edge of bed. Able to tolerate clears. No pain, nausea or vomiting.  Physical Exam: Vitals:   05/28/24 2356 05/29/24 0413 05/29/24 0826 05/29/24 1156  BP: 135/80 119/85 122/82 126/87  Pulse: (!) 59 66 (!) 52 (!) 56  Resp: 17 17 16 17   Temp: 98.6 F (37 C) 98.3 F (36.8 C) 98 F (36.7 C) 98.1 F (36.7 C)  TempSrc:      SpO2: 100% 100% 100% 100%  Weight:  95.2 kg    Height:        General -  Young  [place] male, no apparent distress HEENT - PERRLA, EOMI, atraumatic head, non tender sinuses. Lung - Clear, no rales, rhonchi, wheezes. Heart - S1, S2 heard, no murmurs, rubs, no pedal edema. Abdomen - Soft, non tender, bowel sounds good Neuro - Alert, awake and oriented x 3, non focal exam. Skin - Warm and dry.  Data Reviewed:      Latest Ref Rng & Units 05/28/2024    2:21 AM 05/27/2024    8:41 AM 05/26/2024   11:54 PM  CBC  WBC 4.0 - 10.5 K/uL 7.9  11.3  10.4   Hemoglobin 13.0 - 17.0 g/dL 89.0  88.2  85.9   Hematocrit 39.0 - 52.0 % 36.6  37.9  46.0   Platelets 150 - 400 K/uL 165  167  229       Latest Ref Rng & Units 05/28/2024    2:21 AM  05/27/2024    8:41 AM 05/26/2024   11:54 PM  BMP  Glucose 70 - 99 mg/dL 78  894  868   BUN 6 - 20 mg/dL 10  13  17    Creatinine 0.61 - 1.24 mg/dL 8.81  8.95  8.89   Sodium 135 - 145 mmol/L 142  140  137   Potassium 3.5 - 5.1 mmol/L 3.4  3.4  3.5   Chloride 98 - 111 mmol/L 104  100  91   CO2 22 - 32 mmol/L 28  27  28    Calcium 8.9 - 10.3 mg/dL 8.8  8.9  89.7    DG ESOPHAGUS W SINGLE CM (SOL OR THIN BA) Result Date: 05/28/2024 CLINICAL DATA:  Patient in the hospital with pneumomediastinum secondary to cannabis hyperemesis. Improved and feeling well today with no pain. Radiology consulted for water-soluble esophagram for further evaluation. EXAM: ESOPHAGUS/BARIUM  SWALLOW/TABLET STUDY TECHNIQUE: Single contrast limited examination was performed using water soluble contrast. This exam was performed by Kimble Clas, PA-C, and was supervised and interpreted by Dr. LOISE Molt. FLUOROSCOPY: Radiation Exposure Index (as provided by the fluoroscopic device): 7.10 mGy Kerma COMPARISON:  None Available. FINDINGS: Swallowing: Not evaluated due to limited study Pharynx: Not evaluated due to limited study. Esophagus: Normal appearance. Esophageal motility: Within normal limits. Hiatal Hernia: None. Gastroesophageal reflux: None visualized. Ingested 13 mm barium tablet: Not given Other: None. IMPRESSION: Unremarkable limited esophagram. No extravasation of contrast from the esophagus. Electronically Signed   By: Ree Molt M.D.   On: 05/28/2024 13:33   DG Chest Port 1 View Result Date: 05/28/2024 CLINICAL DATA:  Pneumomediastinum. EXAM: PORTABLE CHEST 1 VIEW COMPARISON:  May 27, 2024. FINDINGS: Normal cardiac size. Continued presence of pneumomediastinum as well as subcutaneous emphysema involving both supraclavicular regions. Lungs are clear. Bony thorax is unremarkable. IMPRESSION: Grossly stable pneumomediastinum as well as bilateral supraclavicular subcutaneous emphysema. Electronically Signed   By: Lynwood Landy Raddle M.D.   On: 05/28/2024 08:39    Family Communication: Discussed with patient, understand and agree. All questions answered.  Disposition: Status is: Inpatient Remains inpatient appropriate because: IV fluids, slow advancement of diet.  Planned Discharge Destination: Home     Time spent: 40 minutes  Author: Concepcion Riser, MD 05/29/2024 3:53 PM Secure chat 7am to 7pm For on call review [email or web address].

## 2024-05-30 DIAGNOSIS — F12188 Cannabis abuse with other cannabis-induced disorder: Secondary | ICD-10-CM | POA: Diagnosis not present

## 2024-05-30 DIAGNOSIS — J982 Interstitial emphysema: Secondary | ICD-10-CM | POA: Diagnosis not present

## 2024-05-30 DIAGNOSIS — K223 Perforation of esophagus: Secondary | ICD-10-CM | POA: Diagnosis not present

## 2024-05-30 DIAGNOSIS — E876 Hypokalemia: Secondary | ICD-10-CM | POA: Diagnosis not present

## 2024-05-30 LAB — BASIC METABOLIC PANEL WITH GFR
Anion gap: 12 (ref 5–15)
BUN: <5 mg/dL — ABNORMAL LOW (ref 6–20)
CO2: 25 mmol/L (ref 22–32)
Calcium: 8.8 mg/dL — ABNORMAL LOW (ref 8.9–10.3)
Chloride: 104 mmol/L (ref 98–111)
Creatinine, Ser: 1.11 mg/dL (ref 0.61–1.24)
GFR, Estimated: >60 mL/min (ref >60)
Glucose, Bld: 83 mg/dL (ref 70–99)
Potassium: 3.7 mmol/L (ref 3.5–5.1)
Sodium: 141 mmol/L (ref 135–145)

## 2024-05-30 LAB — CBC
HCT: 38.3 % — ABNORMAL LOW (ref 39.0–52.0)
Hemoglobin: 11.5 g/dL — ABNORMAL LOW (ref 13.0–17.0)
MCH: 23.7 pg — ABNORMAL LOW (ref 26.0–34.0)
MCHC: 30 g/dL (ref 30.0–36.0)
MCV: 78.8 fL — ABNORMAL LOW (ref 80.0–100.0)
Platelets: 161 K/uL (ref 150–400)
RBC: 4.86 MIL/uL (ref 4.22–5.81)
RDW: 13.7 % (ref 11.5–15.5)
WBC: 6.8 K/uL (ref 4.0–10.5)
nRBC: 0 % (ref 0.0–0.2)

## 2024-05-30 NOTE — Plan of Care (Signed)

## 2024-05-30 NOTE — Progress Notes (Addendum)
      [address] [place] [address]             [phone]    Subjective:  Patient doing well.  Denies N/V.  Objective: Vital signs in last 24 hours: Temp:  [97.9 F (36.6 C)-98.4 F (36.9 C)] 98.1 F (36.7 C) (08/17 0800) Pulse Rate:  [49-56] 55 (08/17 0800) Resp:  [16-18] 16 (08/17 0800) BP: (117-137)/(73-93) 117/83 (08/17 0800) SpO2:  [89 %-100 %] 100 % (08/17 0800)  Intake/Output from previous day: 08/16 0701 - 08/17 0700 In: 472 [P.O.:472] Out: 0   General appearance: alert, cooperative, and no distress Heart: regular rate and rhythm Lungs: clear to auscultation bilaterally Abdomen: soft, non-tender; bowel sounds normal; no masses,  no organomegaly  Lab Results: Recent Labs    05/28/24 0221 05/30/24 0255  WBC 7.9 6.8  HGB 10.9* 11.5*  HCT 36.6* 38.3*  PLT 165 161   BMET:  Recent Labs    05/28/24 0221 05/30/24 0255  NA 142 141  K 3.4* 3.7  CL 104 104  CO2 28 25  GLUCOSE 78 83  BUN 10 <5*  CREATININE 1.18 1.11  CALCIUM 8.8* 8.8*    PT/INR: No results for input(s): LABPROT, INR in the last 72 hours. ABG    Component Value Date/Time   TCO2 39 (H) 11/27/2023 1853   CBG (last 3)  No results for input(s): GLUCAP in the last 72 hours.  Assessment/Plan:  Suspected Boerhave's after N/V likely associated to Marijuana use... CT scan was concerning for perforation, however esophogram showed no evidence of leak  Diet- has tolerated full liquids w/o issue, will advance to regular diet today  Plan: care per medicine... If tolerates regular diet today can likely d/c tomorrow.   LOS: 3 days    Rocky Shad, PA-C 05/30/2024   Chart reviewed, patient examined, agree with above.  He feels fine and tolerated full liquids. Advance to regular diet today and if he does well he can go home tomorrow.

## 2024-05-30 NOTE — Progress Notes (Signed)
 Progress Note   Patient: Jason Hess FMW:[id number] DOB: 06-19-1996 DOA: 05/27/2024     3 DOS: the patient was seen and examined on 05/30/2024   Brief hospital course: 28-year-old male with no significant past medical history who is a frequent user of marijuana and a social drinker.  [facility] with complaints of nausea, vomiting, neck pain, and throat pain.  CT scan of the chest demonstrated extensive pneumomediastinum extending to the lower cervical region and a segment in the anterior aspect of the distal esophageal wall with air within concern for esophageal perforation.  Cardiothoracic surgery was consulted and recommended transfer to [facility] ICU with [facility] admitting. Esophagram showing no extravasation of fluid.  Started on clear liquid diet.  On empiric Zosyn  and fluconazole .   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  Seen by cardiothoracic surgery recommended conservative management.  Esophagram showing no extravasation of fluid.  Able to follow full liquid, advance to soft diet per CT surgery. Continue empiric Zosyn  and fluconazole  therapy.  Pneumomediastinum (HCC) Secondary to esophageal rupture.  Conservative management as per cardiothoracic surgery. Possible discharge tomorrow.  Hypokalemia Oral potassium replacement ordered.  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      Out of bed to chair. Incentive spirometry. Nursing supportive care. Fall, aspiration precautions. Diet:  Diet Orders (From admission, onward)     Start     Ordered   05/30/24 0853  Diet regular Room service appropriate? Yes; Fluid consistency: Thin  Diet effective now       Question Answer Comment  Room service appropriate? Yes   Fluid consistency: Thin      05/30/24 0852           DVT prophylaxis: enoxaparin  (LOVENOX ) injection 40  mg Start: 05/27/24 1000  Level of care: Med-Surg   Code Status: Full Code  Subjective: Patient is seen and examined today morning. Able to eat regular diet. No overnight events.  Physical Exam: Vitals:   05/30/24 0018 05/30/24 0340 05/30/24 0800 05/30/24 1600  BP: 126/73 123/85 117/83 115/74  Pulse: (!) 49 (!) 49 (!) 55 67  Resp: 16 16 16 16   Temp: 97.9 F (36.6 C) 98.2 F (36.8 C) 98.1 F (36.7 C) 98.5 F (36.9 C)  TempSrc: Oral Oral Oral Oral  SpO2: 100% 100% 100% 100%  Weight:      Height:        General -  Young  [place] male, no apparent distress HEENT - PERRLA, EOMI, atraumatic head, non tender sinuses. Lung - Clear, no rales, rhonchi, wheezes. Heart - S1, S2 heard, no murmurs, rubs, no pedal edema. Abdomen - Soft, non tender, bowel sounds good Neuro - Alert, awake and oriented x 3, non focal exam. Skin - Warm and dry.  Data Reviewed:      Latest Ref Rng & Units 05/30/2024    2:55 AM 05/28/2024    2:21 AM 05/27/2024    8:41 AM  CBC  WBC 4.0 - 10.5 K/uL 6.8  7.9  11.3   Hemoglobin 13.0 - 17.0 g/dL 88.4  89.0  88.2   Hematocrit 39.0 - 52.0 % 38.3  36.6  37.9   Platelets 150 - 400 K/uL 161  165  167       Latest Ref Rng & Units 05/30/2024    2:55 AM 05/28/2024    2:21 AM 05/27/2024  8:41 AM  BMP  Glucose 70 - 99 mg/dL 83  78  894   BUN 6 - 20 mg/dL 5  10  13    Creatinine 0.61 - 1.24 mg/dL 8.88  8.81  8.95   Sodium 135 - 145 mmol/L 141  142  140   Potassium 3.5 - 5.1 mmol/L 3.7  3.4  3.4   Chloride 98 - 111 mmol/L 104  104  100   CO2 22 - 32 mmol/L 25  28  27    Calcium 8.9 - 10.3 mg/dL 8.8  8.8  8.9    No results found.   Family Communication: Discussed with patient, understand and agree. All questions answered.  Disposition: Status is: Inpatient Remains inpatient appropriate because: IV fluids, advance diet. Discharge tomorrow.  Planned Discharge Destination: Home     Time spent: 38 minutes  Author: Concepcion Riser,  MD 05/30/2024 5:42 PM Secure chat 7am to 7pm For on call review [email or web address].

## 2024-05-30 NOTE — Plan of Care (Signed)
 Patient calm and cooperative A&O X4. Patient educated on care plane, patient verbalized understanding. No pain or distress noted at this time. Patient left with call bell in reach bed in lowest position and side rails up.  Problem: Education: Goal: Knowledge of General Education information will improve Description: Including pain rating scale, medication(s)/side effects and non-pharmacologic comfort measures Outcome: Progressing   Problem: Health Behavior/Discharge Planning: Goal: Ability to manage health-related needs will improve Outcome: Progressing   Problem: Clinical Measurements: Goal: Will remain free from infection Outcome: Progressing Goal: Respiratory complications will improve Outcome: Progressing   Problem: Activity: Goal: Risk for activity intolerance will decrease Outcome: Progressing   Problem: Elimination: Goal: Will not experience complications related to bowel motility Outcome: Progressing   Problem: Pain Managment: Goal: General experience of comfort will improve and/or be controlled Outcome: Progressing

## 2024-05-31 DIAGNOSIS — E663 Overweight: Secondary | ICD-10-CM

## 2024-05-31 DIAGNOSIS — F12188 Cannabis abuse with other cannabis-induced disorder: Secondary | ICD-10-CM | POA: Diagnosis not present

## 2024-05-31 DIAGNOSIS — K223 Perforation of esophagus: Secondary | ICD-10-CM | POA: Diagnosis not present

## 2024-05-31 DIAGNOSIS — E876 Hypokalemia: Secondary | ICD-10-CM | POA: Diagnosis not present

## 2024-05-31 DIAGNOSIS — J982 Interstitial emphysema: Secondary | ICD-10-CM | POA: Diagnosis not present

## 2024-05-31 MED ORDER — AMOXICILLIN-POT CLAVULANATE 600-42.9 MG/5ML PO SUSR: 600.0000 mg | Freq: Two times a day (BID) | ORAL | 0 refills | Status: AC

## 2024-05-31 MED ORDER — FLUCONAZOLE 10 MG/ML PO SUSR: 100.0000 mg | Freq: Every day | ORAL | 0 refills | Status: AC

## 2024-05-31 MED ORDER — PANTOPRAZOLE SODIUM 40 MG PO PACK: 40.0000 mg | PACK | Freq: Every day | ORAL | 0 refills | Status: AC

## 2024-05-31 MED ORDER — POTASSIUM CHLORIDE 20 MEQ PO PACK: 40.0000 meq | PACK | Freq: Every day | ORAL | 0 refills | Status: AC

## 2024-05-31 NOTE — Plan of Care (Signed)

## 2024-05-31 NOTE — Plan of Care (Signed)
 Patient calm and cooperative A&O X4, medications tolerated well. PIV in both arms maintained. Patient able to ambulate to bathroom independently. Patient left with call bell in reach, bed in lowest position and side rails up.  Problem: Education: Goal: Knowledge of General Education information will improve Description: Including pain rating scale, medication(s)/side effects and non-pharmacologic comfort measures Outcome: Progressing   Problem: Health Behavior/Discharge Planning: Goal: Ability to manage health-related needs will improve Outcome: Progressing   Problem: Clinical Measurements: Goal: Ability to maintain clinical measurements within normal limits will improve Outcome: Progressing   Problem: Clinical Measurements: Goal: Will remain free from infection Outcome: Progressing   Problem: Elimination: Goal: Will not experience complications related to bowel motility Outcome: Progressing   Problem: Elimination: Goal: Will not experience complications related to urinary retention Outcome: Progressing   Problem: Pain Managment: Goal: General experience of comfort will improve and/or be controlled Outcome: Progressing   Problem: Safety: Goal: Ability to remain free from injury will improve Outcome: Progressing

## 2024-05-31 NOTE — Discharge Summary (Signed)
 Physician Discharge Summary   Patient: Jason Hess MRN: [id number] DOB: 1996-04-28  Admit date:     05/27/2024  Discharge date: 05/31/24  Discharge Physician: Concepcion Riser   PCP: Patient, No Pcp Per   Recommendations at discharge:  {Tip this will not be part of the note when signed- Example include specific recommendations for outpatient follow-up, pending tests to follow-up on. 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 *  Hospital Course: 28 year old male with no significant past medical history who is a frequent user of marijuana and a social drinker.  [facility] complaints of nausea, vomiting, neck pain, and throat pain.  CT scan of the chest demonstrated extensive pneumomediastinum extending to the lower cervical region and a segment in the anterior aspect of the distal esophageal wall with air within concern for esophageal perforation.  Cardiothoracic surgery was consulted and recommended transfer to [facility] ICU with [facility]CC[facility] admitting. Esophagram showing no extravasation of fluid.  Started on clear liquid diet.  On empiric Zosyn  and fluconazole .    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  Seen by cardiothoracic surgery recommended conservative management.  Esophagram showing no extravasation of fluid.  Able to follow full liquid, advance to soft diet per CT surgery. Continue empiric Zosyn  and fluconazole  therapy.   Pneumomediastinum (HCC) Secondary to esophageal rupture.  Conservative management as per cardiothoracic surgery. Possible discharge tomorrow.   Hypokalemia Oral potassium replacement  ordered.   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    {Tip this will not be part of the note when signed Body mass index is 30.11 kg/m. , ,  (Optional):26781}  {(NOTE) Pain control PDMP Statment (Optional):26782} Consultants: CT surgery Procedures performed: None  Disposition: Home Diet recommendation:  Discharge Diet Orders (From admission, onward)     Start     Ordered   05/31/24 0000  Diet - low sodium heart healthy        05/31/24 1510           Cardiac diet DISCHARGE MEDICATION: Allergies as of 05/31/2024   No Known Allergies      Medication List     TAKE these medications    acetaminophen  500 MG tablet Commonly known as: TYLENOL  Take 1,000 mg by mouth 2 (two) times daily as needed for moderate pain (pain score 4-6) or headache.   amoxicillin -clavulanate 600-42.9 MG/5ML suspension Commonly known as: AUGMENTIN  Take 5 mLs (600 mg total) by mouth 2 (two) times daily for 7 days.   fluconazole  10 MG/ML suspension Commonly known as: DIFLUCAN  Take 10 mLs (100 mg total) by mouth daily for 7 days.   ondansetron  8 MG disintegrating tablet Commonly known as: ZOFRAN -ODT Take 1 tablet (8 mg total) by mouth every 8 (eight) hours as needed for nausea or vomiting.   pantoprazole  sodium 40 mg Commonly known as: PROTONIX  Place 40 mg into feeding tube daily.   potassium chloride  20 MEQ packet Commonly known as: KLOR-CON  Take 40 mEq by mouth daily for 10 days. Start taking on: June 01, 2024        Follow-up Information     Sebastian Righter  B, MD Follow up on 07/06/2024.   Specialty: Family Medicine Why: You are scheduled for a hospital follow up and new PCP with Dr. Sebastian on July 06, 2024 at 1:20 pm. Contact information: [address] [place] [address] [phone]                Discharge Exam: Fredricka Weights   05/26/24 2343 05/27/24 0500 05/29/24 0413   Weight: 94.3 kg 90.5 kg 95.2 kg      05/31/2024   11:48 AM 05/31/2024    9:24 AM 05/31/2024    4:04 AM  Vitals with BMI  Systolic 108 118 885  Diastolic 55 77 80  Pulse 61 76 56    General -  Young  [place] male, no apparent distress HEENT - PERRLA, EOMI, atraumatic head, non tender sinuses. Lung - Clear, no rales, rhonchi, wheezes. Heart - S1, S2 heard, no murmurs, rubs, no pedal edema. Abdomen - Soft, non tender, bowel sounds good Neuro - Alert, awake and oriented x 3, non focal exam. Skin - Warm and dry.  Condition at discharge: stable  The results of significant diagnostics from this hospitalization (including imaging, microbiology, ancillary and laboratory) are listed below for reference.   Imaging Studies: DG ESOPHAGUS W SINGLE CM (SOL OR THIN BA) Result Date: 05/28/2024 CLINICAL DATA:  Patient in the hospital with pneumomediastinum secondary to cannabis hyperemesis. Improved and feeling well today with no pain. Radiology consulted for water-soluble esophagram for further evaluation. EXAM: ESOPHAGUS/BARIUM SWALLOW/TABLET STUDY TECHNIQUE: Single contrast limited examination was performed using water soluble contrast. This exam was performed by Kimble Clas, PA-C, and was supervised and interpreted by Dr. LOISE Molt. FLUOROSCOPY: Radiation Exposure Index (as provided by the fluoroscopic device): 7.10 mGy Kerma COMPARISON:  None Available. FINDINGS: Swallowing: Not evaluated due to limited study Pharynx: Not evaluated due to limited study. Esophagus: Normal appearance. Esophageal motility: Within normal limits. Hiatal Hernia: None. Gastroesophageal reflux: None visualized. Ingested 13 mm barium tablet: Not given Other: None. IMPRESSION: Unremarkable limited esophagram. No extravasation of contrast from the esophagus. Electronically Signed   By: Ree Molt M.D.   On: 05/28/2024 13:33   DG Chest Port 1 View Result Date: 05/28/2024 CLINICAL DATA:  Pneumomediastinum. EXAM:  PORTABLE CHEST 1 VIEW COMPARISON:  May 27, 2024. FINDINGS: Normal cardiac size. Continued presence of pneumomediastinum as well as subcutaneous emphysema involving both supraclavicular regions. Lungs are clear. Bony thorax is unremarkable. IMPRESSION: Grossly stable pneumomediastinum as well as bilateral supraclavicular subcutaneous emphysema. Electronically Signed   By: Lynwood Landy Raddle M.D.   On: 05/28/2024 08:39   DG Chest Port 1 View Result Date: 05/27/2024 CLINICAL DATA:  Pneumomediastinum. EXAM: PORTABLE CHEST 1 VIEW COMPARISON:  Radiograph yesterday.  CT earlier today FINDINGS: Pneumomediastinum again demonstrated in the upper mediastinum into the supraclavicular soft tissues. Supraclavicular soft tissue air has slightly increased on the right. No visible pneumothorax. Normal heart size. No developing airspace disease. No significant pleural effusion. IMPRESSION: Pneumomediastinum again demonstrated in the upper mediastinum into the supraclavicular soft tissues. Supraclavicular soft tissue air has slightly increased on the right. No visible pneumothorax. Electronically Signed   By: Andrea Gasman M.D.   On: 05/27/2024 10:36   CT Chest Wo Contrast Result Date: 05/27/2024 CLINICAL DATA:  Pneumomediastinum on recent chest x-ray evaluate for possible esophageal abnormality EXAM: CT CHEST WITHOUT CONTRAST TECHNIQUE: Multidetector CT imaging of the chest was performed following the standard protocol without IV contrast. RADIATION DOSE REDUCTION: This exam was performed according to the  departmental dose-optimization program which includes automated exposure control, adjustment of the mA and/or kV according to patient size and/or use of iterative reconstruction technique. COMPARISON:  Chest x-ray from earlier in the same day. FINDINGS: Cardiovascular: Somewhat limited due to lack of IV contrast. Thoracic aorta is within normal limits. Heart is not significantly enlarged. Mediastinum/Nodes: Thoracic inlet  demonstrates considerable subcutaneous air which tracks into the superior mediastinum. The air tracks inferiorly in the mediastinum surrounding the esophagus. The esophagus shows contrast material within. No contrast extravasation is identified although there is an area of air within the esophageal wall distally best seen on image number 106 of series 302. The gastroesophageal junction is within normal limits. Lungs/Pleura: No pneumothorax is noted. Pneumomediastinum is seen as previously described. Lungs are well aerated without focal infiltrate or sizable effusion. Upper Abdomen: Visualized upper abdomen shows no acute abnormality. Musculoskeletal: No chest wall mass or suspicious bone lesions identified. IMPRESSION: Extensive pneumomediastinum extending into the lower cervical region similar to that seen on prior plain film. Small rent in the anterior aspect of the distal esophageal wall with air within. This is the etiology of the pneumomediastinum. No definitive extravasation of contrast is noted. Critical Value/emergent results were called by telephone at the time of interpretation on 05/27/2024 at 1:35 am to Dr. APRIL PALUMBO , who verbally acknowledged these results. Electronically Signed   By: Oneil Devonshire M.D.   On: 05/27/2024 01:35   DG Chest Portable 1 View Result Date: 05/27/2024 CLINICAL DATA:  Nausea and vomiting for several days EXAM: PORTABLE CHEST 1 VIEW COMPARISON:  10/03/2020 FINDINGS: Cardiac shadow is within normal limits. The lungs are well aerated bilaterally. No pneumothorax is seen. Mild changes of pneumomediastinum are noted however. Air is noted in the soft tissues of the neck as well as in the supraclavicular regions bilaterally. IMPRESSION: Findings of mild pneumomediastinum extending into the neck. Given the clinical history the possibility of esophageal injury deserves consideration. CT of the chest with oral contrast administered just prior to scanning is recommended. Critical  Value/emergent results were called by telephone at the time of interpretation on 05/27/2024 at 12:21 am to Dr. APRIL PALUMBO , who verbally acknowledged these results. Electronically Signed   By: Oneil Devonshire M.D.   On: 05/27/2024 00:24    Microbiology: Results for orders placed or performed during the hospital encounter of 05/27/24  Blood culture (routine x 2)     Status: None (Preliminary result)   Collection Time: 05/27/24  1:36 AM   Specimen: BLOOD  Result Value Ref Range Status   Specimen Description   Final    BLOOD LEFT ANTECUBITAL Performed at [facility], [address] [place], [place] [address]    Special Requests   Final    BOTTLES DRAWN AEROBIC AND ANAEROBIC Blood Culture adequate volume Performed at [facility], [address], [place], [place] [address]    Culture   Final    NO GROWTH 4 DAYS Performed at [facility], [address], [place], [place] [address]    Report Status PENDING  Incomplete  Blood culture (routine x 2)     Status: None (Preliminary result)   Collection Time: 05/27/24  1:41 AM   Specimen: BLOOD  Result Value Ref Range Status   Specimen Description   Final    BLOOD RIGHT ANTECUBITAL Performed at [facility], [address] [place], [place] [address]    Special Requests   Final  BOTTLES DRAWN AEROBIC AND ANAEROBIC Blood Culture adequate volume Performed at [facility], [address] [place], [place] [address]    Culture   Final    NO GROWTH 4 DAYS Performed at [facility], [address] [place], [place] [address]    Report Status PENDING  Incomplete  MRSA Next Gen by PCR, Nasal     Status: None   Collection Time: 05/27/24  4:21 AM   Specimen: Nasal Mucosa; Nasal Swab  Result Value Ref Range Status   MRSA by PCR Next Gen NOT DETECTED NOT DETECTED Final    Comment: (NOTE) The GeneXpert MRSA Assay (FDA approved for NASAL specimens only), is one component of a comprehensive  MRSA colonization surveillance program. It is not intended to diagnose MRSA infection nor to guide or monitor treatment for MRSA infections. Test performance is not FDA approved in patients less than 25 years old. Performed at [facility], [address], [place], [place] [address]     Labs: CBC: Recent Labs  Lab 05/26/24 2354 05/27/24 0841 05/28/24 0221 05/30/24 0255  WBC 10.4 11.3* 7.9 6.8  NEUTROABS 8.7*  --   --   --   HGB 14.0 11.7* 10.9* 11.5*  HCT 46.0 37.9* 36.6* 38.3*  MCV 76.0* 76.4* 78.0* 78.8*  PLT 229 167 165 161   Basic Metabolic Panel: Recent Labs  Lab 05/26/24 2354 05/27/24 0841 05/28/24 0221 05/30/24 0255  NA 137 140 142 141  K 3.5 3.4* 3.4* 3.7  CL 91* 100 104 104  CO2 28 27 28 25   GLUCOSE 131* 105* 78 83  BUN 17 13 10  <5*  CREATININE 1.10 1.04 1.18 1.11  CALCIUM 10.2 8.9 8.8* 8.8*  MG  --   --  1.8  --   PHOS  --   --  2.8  --    Liver Function Tests: Recent Labs  Lab 05/27/24 0841  AST 12*  ALT 11  ALKPHOS 88  BILITOT 0.8  PROT 6.9  ALBUMIN 3.6   CBG: No results for input(s): GLUCAP in the last 168 hours.  Discharge time spent: 35 minutes.  Signed: Concepcion Riser, MD [facility] 05/31/2024

## 2024-05-31 NOTE — Progress Notes (Signed)
      [address] [place] [address]             [phone]    Subjective:  Reports no difficulty with regular diet all day yesterday. Denies cough, pain or nausea.   Objective: Vital signs in last 24 hours: Temp:  [98.1 F (36.7 C)-99.2 F (37.3 C)] 98.7 F (37.1 C) (08/18 0404) Pulse Rate:  [55-70] 56 (08/18 0404) Resp:  [16] 16 (08/18 0404) BP: (114-122)/(74-83) 114/80 (08/18 0404) SpO2:  [99 %-100 %] 99 % (08/18 0404)  Intake/Output from previous day: No intake/output data recorded.  General appearance: alert, cooperative, and no distress Heart: regular rate and rhythm Lungs: normal work of breathing on RA. Breath sounds clear.  Abdomen: soft, non-tender   Lab Results: Recent Labs    05/30/24 0255  WBC 6.8  HGB 11.5*  HCT 38.3*  PLT 161   BMET:  Recent Labs    05/30/24 0255  NA 141  K 3.7  CL 104  CO2 25  GLUCOSE 83  BUN <5*  CREATININE 1.11  CALCIUM 8.8*    PT/INR: No results for input(s): LABPROT, INR in the last 72 hours. ABG    Component Value Date/Time   TCO2 39 (H) 11/27/2023 1853   CBG (last 3)  No results for input(s): GLUCAP in the last 72 hours.  Assessment/Plan:  Suspected Boerhave's related to cannabis hyperemesis. Follow up esophagram negative with no evidence of perforation. He has remained afebrile and has normal WBC. Tolerating regular diet. OK to discharge to home from CT surgery standpoint.  ABX mgt per primary team.     LOS: 4 days    Jason Char G. Jakiah Goree, PA-C 05/31/2024

## 2024-05-31 NOTE — Progress Notes (Signed)
 Med list completed, Heather RN notified to review and update as needed.

## 2024-06-01 LAB — CULTURE, BLOOD (ROUTINE X 2)
Culture: NO GROWTH
Culture: NO GROWTH
Special Requests: ADEQUATE
Special Requests: ADEQUATE

## 2024-07-06 ENCOUNTER — Ambulatory Visit: Admitting: Family Medicine

## 2024-09-15 ENCOUNTER — Emergency Department (HOSPITAL_COMMUNITY)
Admission: EM | Admit: 2024-09-15 | Discharge: 2024-09-15 | Disposition: A | Discharge status: Home / Self Care | Attending: Emergency Medicine | Admitting: Emergency Medicine

## 2024-09-15 ENCOUNTER — Other Ambulatory Visit: Payer: Self-pay

## 2024-09-15 ENCOUNTER — Encounter (HOSPITAL_COMMUNITY): Payer: Self-pay

## 2024-09-15 DIAGNOSIS — R1116 Cannabis hyperemesis syndrome: Secondary | ICD-10-CM | POA: Diagnosis not present

## 2024-09-15 DIAGNOSIS — R112 Nausea with vomiting, unspecified: Secondary | ICD-10-CM | POA: Diagnosis present

## 2024-09-15 DIAGNOSIS — E876 Hypokalemia: Secondary | ICD-10-CM | POA: Diagnosis not present

## 2024-09-15 DIAGNOSIS — F1290 Cannabis use, unspecified, uncomplicated: Secondary | ICD-10-CM | POA: Insufficient documentation

## 2024-09-15 LAB — URINALYSIS, ROUTINE W REFLEX MICROSCOPIC
Bilirubin Urine: NEGATIVE
Glucose, UA: NEGATIVE mg/dL
Ketones, ur: 5 mg/dL — AB
Leukocytes,Ua: NEGATIVE
Nitrite: NEGATIVE
Protein, ur: >=300 mg/dL — AB
Specific Gravity, Urine: 1.029 (ref 1.005–1.030)
pH: 6 (ref 5.0–8.0)

## 2024-09-15 LAB — LIPASE, BLOOD: Lipase: 15 U/L (ref 11–51)

## 2024-09-15 LAB — URINE DRUG SCREEN
Amphetamines: NEGATIVE
Barbiturates: NEGATIVE
Benzodiazepines: NEGATIVE
Cocaine: NEGATIVE
Fentanyl: NEGATIVE
Methadone Scn, Ur: NEGATIVE
Opiates: NEGATIVE
Tetrahydrocannabinol: POSITIVE — AB

## 2024-09-15 LAB — COMPREHENSIVE METABOLIC PANEL WITH GFR
ALT: 10 U/L (ref 0–44)
AST: 17 U/L (ref 15–41)
Albumin: 4.9 g/dL (ref 3.5–5.0)
Alkaline Phosphatase: 113 U/L (ref 38–126)
Anion gap: 14 (ref 5–15)
BUN: 19 mg/dL (ref 6–20)
CO2: 34 mmol/L — ABNORMAL HIGH (ref 22–32)
Calcium: 9.9 mg/dL (ref 8.9–10.3)
Chloride: 90 mmol/L — ABNORMAL LOW (ref 98–111)
Creatinine, Ser: 1.09 mg/dL (ref 0.61–1.24)
GFR, Estimated: >60 mL/min (ref >60)
Glucose, Bld: 129 mg/dL — ABNORMAL HIGH (ref 70–99)
Potassium: 2.8 mmol/L — ABNORMAL LOW (ref 3.5–5.1)
Sodium: 138 mmol/L (ref 135–145)
Total Bilirubin: 0.6 mg/dL (ref 0.0–1.2)
Total Protein: 9.2 g/dL — ABNORMAL HIGH (ref 6.5–8.1)

## 2024-09-15 LAB — CBC
HCT: 44.5 % (ref 39.0–52.0)
Hemoglobin: 13.7 g/dL (ref 13.0–17.0)
MCH: 23.3 pg — ABNORMAL LOW (ref 26.0–34.0)
MCHC: 30.8 g/dL (ref 30.0–36.0)
MCV: 75.8 fL — ABNORMAL LOW (ref 80.0–100.0)
Platelets: 199 K/uL (ref 150–400)
RBC: 5.87 MIL/uL — ABNORMAL HIGH (ref 4.22–5.81)
RDW: 14.1 % (ref 11.5–15.5)
WBC: 8.5 K/uL (ref 4.0–10.5)
nRBC: 0 % (ref 0.0–0.2)

## 2024-09-15 MED ORDER — ONDANSETRON HCL 4 MG/2ML IJ SOLN
4.0000 mg | Freq: Once | INTRAMUSCULAR | Status: AC
  Administered 2024-09-15: 4 mg via INTRAVENOUS
  Filled 2024-09-15: qty 2

## 2024-09-15 MED ORDER — ONDANSETRON 4 MG PO TBDP: 4.0000 mg | ORAL_TABLET | Freq: Three times a day (TID) | ORAL | 0 refills | Status: AC | PRN

## 2024-09-15 MED ORDER — SODIUM CHLORIDE 0.9 % IV BOLUS
1000.0000 mL | Freq: Once | INTRAVENOUS | Status: AC
  Administered 2024-09-15: 1000 mL via INTRAVENOUS

## 2024-09-15 MED ORDER — POTASSIUM CHLORIDE CRYS ER 20 MEQ PO TBCR
60.0000 meq | EXTENDED_RELEASE_TABLET | Freq: Once | ORAL | Status: AC
  Administered 2024-09-15: 60 meq via ORAL
  Filled 2024-09-15: qty 3

## 2024-09-15 NOTE — Discharge Instructions (Addendum)
 It was a pleasure taking care of you today. You were seen in the Emergency Department for nausea and vomiting. Your work-up was reassuring. Your CT/Xray/Labs showed no evidence of acute infection or abnormality to explain your symptoms.  However, your potassium was relatively low, likely secondary to vomiting.  I did replete that here in the emergency department.  Based on your past emergency department visits, I do believe this may be related to cannabinoid hyperemesis syndrome.  This can occur when some people smoke or use marijuana and have adverse effects.  I recommend stopping marijuana use to avoid this from continuing to occur.  I did send a prescription of Zofran , which is an antinausea medicine to your pharmacy.  Refer to the attached documentation for further management of your symptoms. Follow up with your PCP if your symptoms continue.  Please return to the ER if you experience chest pain, trouble breathing, intractable nausea/vomiting or any other life threatening illnesses.

## 2024-09-15 NOTE — ED Provider Notes (Signed)
 I provided a substantive portion of the care of this patient.  I personally made/approved the management plan for this patient and take responsibility for the patient management.      28 year old male here with nausea vomiting abdominal Monday.  Labs here are reassuring.  He is hydrated.  He can take oral fluids.  I do not feel that he needs any intra-abdominal imaging.  Do not feel that he needs to have any intra-abdominal imaging and patient can be discharged home   Dasie Faden, MD 09/15/24 1437

## 2024-09-15 NOTE — ED Triage Notes (Signed)
 Pt c/o N&V that started Monday; unable to count the number of vomiting episodes today. Denies SOB.

## 2024-09-15 NOTE — ED Provider Notes (Signed)
  EMERGENCY DEPARTMENT AT [facility] Provider Note   CSN: [id number] Arrival date & time: 09/15/24  1151     Patient presents with: Emesis and Nausea   Jason Hess is a 28 y.o. male with no pertinent past medical history presents emergency department for nausea and vomiting.  Patient reports his symptoms began Saturday night.  He states he is unable to hold down any food or liquids since that time.  Patient denies hematemesis.  No changes to his bowel movements or reported diarrhea.  No abdominal pain.  Patient denies fever, body aches but reports chills.  He denies any sick contacts to his knowledge.   Emesis      Prior to Admission medications   Medication Sig Start Date End Date Taking? Authorizing Provider  ondansetron  (ZOFRAN -ODT) 4 MG disintegrating tablet Take 1 tablet (4 mg total) by mouth every 8 (eight) hours as needed for nausea or vomiting. 09/15/24  Yes Herrick Hartog, Marry RAMAN, PA-C  acetaminophen  (TYLENOL ) 500 MG tablet Take 1,000 mg by mouth 2 (two) times daily as needed for moderate pain (pain score 4-6) or headache.    [provider]  pantoprazole  sodium (PROTONIX ) 40 mg Place 40 mg into feeding tube daily. 05/31/24   Darci Pore, MD  potassium chloride  (KLOR-CON ) 20 MEQ packet Take 40 mEq by mouth daily for 10 days. 06/01/24 06/11/24  Darci Pore, MD    Allergies: Patient has no known allergies.    Review of Systems  Gastrointestinal:  Positive for nausea and vomiting.    Updated Vital Signs BP (!) 155/88   Pulse (!) 54   Temp 97.6 F (36.4 C) (Oral)   Resp 16   SpO2 99%   Physical Exam Vitals and nursing note reviewed.  Constitutional:      Appearance: Normal appearance.  HENT:     Head: Normocephalic and atraumatic.     Mouth/Throat:     Mouth: Mucous membranes are moist.  Eyes:     General: No scleral icterus.       Right eye: No discharge.        Left eye: No discharge.     Conjunctiva/sclera:  Conjunctivae normal.  Cardiovascular:     Rate and Rhythm: Normal rate and regular rhythm.     Pulses: Normal pulses.  Pulmonary:     Effort: Pulmonary effort is normal.     Breath sounds: Normal breath sounds.  Abdominal:     General: There is no distension.     Tenderness: There is no abdominal tenderness.  Musculoskeletal:        General: No deformity.     Cervical back: Normal range of motion.  Skin:    General: Skin is warm and dry.     Capillary Refill: Capillary refill takes less than 2 seconds.  Neurological:     Mental Status: He is alert.     Motor: No weakness.  Psychiatric:        Mood and Affect: Mood normal.     (all labs ordered are listed, but only abnormal results are displayed) Labs Reviewed  COMPREHENSIVE METABOLIC PANEL WITH GFR - Abnormal; Notable for the following components:      Result Value   Potassium 2.8 (*)    Chloride 90 (*)    CO2 34 (*)    Glucose, Bld 129 (*)    Total Protein 9.2 (*)    All other components within normal limits  CBC - Abnormal; Notable for the following  components:   RBC 5.87 (*)    MCV 75.8 (*)    MCH 23.3 (*)    All other components within normal limits  LIPASE, BLOOD  URINALYSIS, ROUTINE W REFLEX MICROSCOPIC  URINE DRUG SCREEN    EKG: None  Radiology: No results found.   Procedures   Medications Ordered in the ED  sodium chloride  0.9 % bolus 1,000 mL (0 mLs Intravenous Stopped 09/15/24 1419)  ondansetron  (ZOFRAN ) injection 4 mg (4 mg Intravenous Given 09/15/24 1318)  potassium chloride  SA (KLOR-CON  M) CR tablet 60 mEq (60 mEq Oral Given 09/15/24 1504)                                 Medical Decision Making Amount and/or Complexity of Data Reviewed Labs: ordered.  Risk Prescription drug management.   This patient presents to the ED for concern of nausea and vomiting, this involves an extensive number of treatment options, and is a complaint that carries with it a high risk of complications and  morbidity.   Differential diagnosis includes: Gastroenteritis, cannabis hyperemesis syndrome, appendicitis, cholecystitis, cholelithiasis, foodborne illness  Co morbidities:  none  Social Determinants of Health:   Medicaid use  Additional history:  Patient frequently evaluated in the emergency department for similar symptoms and found to be diagnosed with cannabis hyperemesis syndrome  Lab Tests:  I Ordered, and personally interpreted labs.  The pertinent results include:    - Potassium: 2.8  Imaging Studies:  Not indicated  Cardiac Monitoring/ECG:  The patient was maintained on a cardiac monitor.  I personally viewed and interpreted the cardiac monitored which showed an underlying rhythm of: Sinus bradycardia  Medicines ordered and prescription drug management:  I ordered medication including  Medications  sodium chloride  0.9 % bolus 1,000 mL (0 mLs Intravenous Stopped 09/15/24 1419)  ondansetron  (ZOFRAN ) injection 4 mg (4 mg Intravenous Given 09/15/24 1318)  potassium chloride  SA (KLOR-CON  M) CR tablet 60 mEq (60 mEq Oral Given 09/15/24 1504)   for vomiting and dehydration Reevaluation of the patient after these medicines showed that the patient improved I have reviewed the patients home medicines and have made adjustments as needed  Test Considered:   none  Critical Interventions:   none  Consultations Obtained: None  Problem List / ED Course:     ICD-10-CM   1. Cannabinoid hyperemesis syndrome  R11.16     2. Hypokalemia  E87.6       MDM: 28 year old male who presents emergency department for evaluation of nausea and vomiting.  Patient's symptoms started on Saturday night.  Patient denies any suspicious foods.  No fever, body aches, abdominal pain or diarrhea.  Patient continues to report nausea and inability to keep down solids or liquids.  Lab work shows hypokalemia of 2.8, which is consistent with patient's history of vomiting.  I gave the patient a  liter of IV fluids and Zofran  for his symptoms.  Upon reassessment, patient reports he is feeling better.  He is tolerating food and liquids well.  Patient's UA and UDS are currently pending, however I do suspect cannabinoid hyperemesis syndrome is the likely cause of patient's symptoms.  I have educated the patient on the importance of refraining from marijuana use due to his history of frequent vomiting after smoking.  Patient's potassium has been appropriately repleted.  At this time, patient's vital signs are stable.  Patient is appropriate for discharge.   Dispostion:  After  consideration of the diagnostic results and the patients response to treatment, I feel that the patient would benefit from supportive care.    Final diagnoses:  Cannabinoid hyperemesis syndrome  Hypokalemia    ED Discharge Orders          Ordered    ondansetron  (ZOFRAN -ODT) 4 MG disintegrating tablet  Every 8 hours PRN        09/15/24 1543               Torrence Marry RAMAN, PA-C 09/15/24 1545    Dasie Faden, MD 09/16/24 [phone]

## 2024-09-15 NOTE — ED Notes (Signed)
Pt is actively throwing up.

## 2024-10-30 ENCOUNTER — Other Ambulatory Visit: Payer: Self-pay

## 2024-10-30 ENCOUNTER — Emergency Department (HOSPITAL_BASED_OUTPATIENT_CLINIC_OR_DEPARTMENT_OTHER)
Admission: EM | Admit: 2024-10-30 | Discharge: 2024-10-31 | Disposition: A | Attending: Emergency Medicine | Admitting: Emergency Medicine

## 2024-10-30 ENCOUNTER — Encounter (HOSPITAL_BASED_OUTPATIENT_CLINIC_OR_DEPARTMENT_OTHER): Payer: Self-pay | Admitting: Emergency Medicine

## 2024-10-30 DIAGNOSIS — F1210 Cannabis abuse, uncomplicated: Secondary | ICD-10-CM

## 2024-10-30 DIAGNOSIS — F12188 Cannabis abuse with other cannabis-induced disorder: Secondary | ICD-10-CM | POA: Diagnosis not present

## 2024-10-30 DIAGNOSIS — R112 Nausea with vomiting, unspecified: Secondary | ICD-10-CM | POA: Diagnosis present

## 2024-10-30 LAB — COMPREHENSIVE METABOLIC PANEL WITH GFR
ALT: 12 U/L (ref 0–44)
AST: 19 U/L (ref 15–41)
Albumin: 5.1 g/dL — ABNORMAL HIGH (ref 3.5–5.0)
Alkaline Phosphatase: 118 U/L (ref 38–126)
Anion gap: 15 (ref 5–15)
BUN: 9 mg/dL (ref 6–20)
CO2: 34 mmol/L — ABNORMAL HIGH (ref 22–32)
Calcium: 9.6 mg/dL (ref 8.9–10.3)
Chloride: 84 mmol/L — ABNORMAL LOW (ref 98–111)
Creatinine, Ser: 1 mg/dL (ref 0.61–1.24)
GFR, Estimated: >60 mL/min
Glucose, Bld: 128 mg/dL — ABNORMAL HIGH (ref 70–99)
Potassium: 2.7 mmol/L — CL (ref 3.5–5.1)
Sodium: 133 mmol/L — ABNORMAL LOW (ref 135–145)
Total Bilirubin: 0.7 mg/dL (ref 0.0–1.2)
Total Protein: 8.7 g/dL — ABNORMAL HIGH (ref 6.5–8.1)

## 2024-10-30 LAB — CBC WITH DIFFERENTIAL/PLATELET
Abs Immature Granulocytes: 0.02 K/uL (ref 0.00–0.07)
Basophils Absolute: 0 K/uL (ref 0.0–0.1)
Basophils Relative: 0 %
Eosinophils Absolute: 0 K/uL (ref 0.0–0.5)
Eosinophils Relative: 0 %
HCT: 45.9 % (ref 39.0–52.0)
Hemoglobin: 14.4 g/dL (ref 13.0–17.0)
Immature Granulocytes: 0 %
Lymphocytes Relative: 30 %
Lymphs Abs: 2.8 K/uL (ref 0.7–4.0)
MCH: 23.6 pg — ABNORMAL LOW (ref 26.0–34.0)
MCHC: 31.4 g/dL (ref 30.0–36.0)
MCV: 75.4 fL — ABNORMAL LOW (ref 80.0–100.0)
Monocytes Absolute: 1.1 K/uL — ABNORMAL HIGH (ref 0.1–1.0)
Monocytes Relative: 12 %
Neutro Abs: 5.3 K/uL (ref 1.7–7.7)
Neutrophils Relative %: 58 %
Platelets: 220 K/uL (ref 150–400)
RBC: 6.09 MIL/uL — ABNORMAL HIGH (ref 4.22–5.81)
RDW: 14.1 % (ref 11.5–15.5)
WBC: 9.2 K/uL (ref 4.0–10.5)
nRBC: 0 % (ref 0.0–0.2)

## 2024-10-30 LAB — URINALYSIS, ROUTINE W REFLEX MICROSCOPIC
Bilirubin Urine: NEGATIVE
Glucose, UA: NEGATIVE mg/dL
Ketones, ur: NEGATIVE mg/dL
Leukocytes,Ua: NEGATIVE
Nitrite: NEGATIVE
Protein, ur: 100 mg/dL — AB
Specific Gravity, Urine: 1.015 (ref 1.005–1.030)
pH: 6 (ref 5.0–8.0)

## 2024-10-30 LAB — URINALYSIS, MICROSCOPIC (REFLEX)

## 2024-10-30 LAB — LIPASE, BLOOD: Lipase: 29 U/L (ref 11–51)

## 2024-10-30 MED ORDER — SODIUM CHLORIDE 0.9 % IV BOLUS
1000.0000 mL | Freq: Once | INTRAVENOUS | Status: AC
  Administered 2024-10-30: 1000 mL via INTRAVENOUS

## 2024-10-30 MED ORDER — POTASSIUM CHLORIDE 10 MEQ/100ML IV SOLN
10.0000 meq | INTRAVENOUS | Status: AC
  Administered 2024-10-30 – 2024-10-31 (×3): 10 meq via INTRAVENOUS
  Filled 2024-10-30 (×3): qty 100

## 2024-10-30 MED ORDER — ONDANSETRON HCL 4 MG/2ML IJ SOLN
4.0000 mg | Freq: Once | INTRAMUSCULAR | Status: AC
  Administered 2024-10-30: 4 mg via INTRAVENOUS
  Filled 2024-10-30: qty 2

## 2024-10-30 NOTE — ED Triage Notes (Addendum)
 Pt with NV since last night; denies pain; tolerating liquids but not food

## 2024-10-30 NOTE — ED Provider Notes (Signed)
 " [facility] Provider Note   CSN: [id number] Arrival date & time: 10/30/24  8084     Patient presents with: Emesis   Jason Hess is a 29 y.o. male.   Patient is here for evaluation of persistent nausea and vomiting that began yesterday evening.  He has been unable to keep down food though notes he has been able to keep down water and Kool-Aid.  He does continue to have an appetite however he is unable to keep down food without vomiting.  Denies ingestion of suspicious foods.  Denies fever, body aches, abdominal pain, hematemesis, or diarrhea.  Denies change in bowels or urine.  Denies alcohol use.  Denies known sick contacts.  Denies dizziness or syncope.  The history is provided by the patient.  Emesis Severity:  Severe Associated symptoms: no abdominal pain        Prior to Admission medications  Medication Sig Start Date End Date Taking? Authorizing Provider  acetaminophen  (TYLENOL ) 500 MG tablet Take 1,000 mg by mouth 2 (two) times daily as needed for moderate pain (pain score 4-6) or headache.    [provider]  ondansetron  (ZOFRAN -ODT) 4 MG disintegrating tablet Take 1 tablet (4 mg total) by mouth every 8 (eight) hours as needed for nausea or vomiting. 09/15/24   Dufour, Marry RAMAN, PA-C  pantoprazole  sodium (PROTONIX ) 40 mg Place 40 mg into feeding tube daily. 05/31/24   Darci Pore, MD  potassium chloride  (KLOR-CON ) 20 MEQ packet Take 40 mEq by mouth daily for 10 days. 06/01/24 06/11/24  Darci Pore, MD    Allergies: Patient has no known allergies.    Review of Systems  Gastrointestinal:  Positive for vomiting. Negative for abdominal pain.    Updated Vital Signs BP (!) 147/102 (BP Location: Right Arm)   Pulse 76   Temp 98.7 F (37.1 C)   Resp 16   Ht 5' 10 (1.778 m)   Wt 97.5 kg   SpO2 97%   BMI 30.85 kg/m   Physical Exam Vitals and nursing note reviewed.  Constitutional:      General: He  is not in acute distress.    Appearance: Normal appearance. He is normal weight. He is not ill-appearing, toxic-appearing or diaphoretic.  HENT:     Head: Normocephalic and atraumatic.     Nose: Nose normal.     Mouth/Throat:     Mouth: Mucous membranes are moist.  Eyes:     General: No scleral icterus.    Extraocular Movements: Extraocular movements intact.     Conjunctiva/sclera: Conjunctivae normal.  Cardiovascular:     Rate and Rhythm: Normal rate and regular rhythm.     Pulses: Normal pulses.     Heart sounds: Normal heart sounds.  Pulmonary:     Effort: Pulmonary effort is normal. No respiratory distress.     Breath sounds: Normal breath sounds. No stridor. No wheezing, rhonchi or rales.  Abdominal:     General: Abdomen is flat. Bowel sounds are normal. There is no distension.     Palpations: Abdomen is soft.     Tenderness: There is no abdominal tenderness. There is no guarding.  Musculoskeletal:        General: Normal range of motion.     Cervical back: Normal range of motion.  Skin:    General: Skin is warm and dry.     Capillary Refill: Capillary refill takes less than 2 seconds.     Coloration: Skin is  not jaundiced or pale.  Neurological:     Mental Status: He is alert and oriented to person, place, and time.     (all labs ordered are listed, but only abnormal results are displayed) Labs Reviewed  COMPREHENSIVE METABOLIC PANEL WITH GFR - Abnormal; Notable for the following components:      Result Value   Sodium 133 (*)    Potassium 2.7 (*)    Chloride 84 (*)    CO2 34 (*)    Glucose, Bld 128 (*)    Total Protein 8.7 (*)    Albumin 5.1 (*)    All other components within normal limits  CBC WITH DIFFERENTIAL/PLATELET - Abnormal; Notable for the following components:   RBC 6.09 (*)    MCV 75.4 (*)    MCH 23.6 (*)    Monocytes Absolute 1.1 (*)    All other components within normal limits  LIPASE, BLOOD  URINALYSIS, ROUTINE W REFLEX MICROSCOPIC     EKG: None  Radiology: No results found.   Procedures   Medications Ordered in the ED - No data to display   Patient presents to the ED for concern of nausea and vomiting, this involves an extensive number of treatment options, and is a complaint that carries with it a high risk of complications and morbidity.  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   Lab Tests:  I Ordered, and personally interpreted labs.  The pertinent results include: Potassium of 2.7   Imaging Studies:  Not indicated.   Cardiac Monitoring:  The patient was maintained on a cardiac monitor.  I personally viewed and interpreted the cardiac monitored which showed an underlying rhythm of: ***   Medicines ordered and prescription drug management:  I ordered medication including ***  for ***  Reevaluation of the patient after these medicines showed that the patient {resolved/improved/worsened:23923::improved} I have reviewed the patients home medicines and have made adjustments as needed   Test Considered:  ***   Critical Interventions:  IV potassium given for hypokalemia.   Problem List / ED Course:  Clinical Course as of 10/30/24 2307  Sat Oct 30, 2024  2244 Patient denies nausea at this time and feels he is ready to attempt eating. We will do a p.o. challenge. [EA]    Clinical Course User Index [EA] Rosina Norris A, PA-C    Nausea/vomiting.*** Hypokalemia.***   Reevaluation:  After the interventions noted above, I reevaluated the patient and found that they have :{resolved/improved/worsened:23923::improved}   Social Determinants of Health:  Medicaid use   Dispostion:  After consideration of the diagnostic results and the patients response to treatment, I feel that the patent would benefit from ***.   Clinical Course as of 10/30/24 2307  Sat Oct 30, 2024  2244 Patient denies nausea at this time and feels he is ready to attempt eating. We will do a p.o. challenge. [EA]    Clinical Course User Index [EA] Rosina Norris LABOR, PA-C   {Click here for ABCD2, HEART and other calculators  REFRESH Note before signing:1}                              Medical Decision Making Amount and/or Complexity of Data Reviewed Labs: ordered.   This note was produced using Electronics Engineer. While the provider has reviewed and verified all clinical information, transcription errors may remain.    Final diagnoses:  None    ED Discharge Orders     None        "

## 2024-10-31 MED ORDER — PROMETHAZINE HCL 25 MG RE SUPP: 25.0000 mg | Freq: Four times a day (QID) | RECTAL | 0 refills | Status: AC | PRN

## 2024-10-31 MED ORDER — ONDANSETRON HCL 4 MG PO TABS: 4.0000 mg | ORAL_TABLET | Freq: Four times a day (QID) | ORAL | 0 refills | Status: AC
